# Patient Record
Sex: Male | Born: 1955 | Race: White | Hispanic: No | Marital: Single | Smoking: Former smoker
Health system: Southern US, Community
[De-identification: ages and names within clinical notes are randomized; demographics above are authoritative.]

## PROBLEM LIST (undated history)

## (undated) DIAGNOSIS — I10 Essential (primary) hypertension: Secondary | ICD-10-CM

## (undated) DIAGNOSIS — M1990 Unspecified osteoarthritis, unspecified site: Secondary | ICD-10-CM

## (undated) HISTORY — DX: Unspecified osteoarthritis, unspecified site: M19.90

## (undated) HISTORY — DX: Essential (primary) hypertension: I10

---

## 2012-12-14 DEATH — deceased

## 2018-04-10 ENCOUNTER — Encounter: Payer: Self-pay | Admitting: Cardiology

## 2018-04-10 ENCOUNTER — Ambulatory Visit (INDEPENDENT_AMBULATORY_CARE_PROVIDER_SITE_OTHER): Payer: PRIVATE HEALTH INSURANCE | Admitting: Cardiology

## 2018-04-10 ENCOUNTER — Ambulatory Visit (INDEPENDENT_AMBULATORY_CARE_PROVIDER_SITE_OTHER): Payer: PRIVATE HEALTH INSURANCE

## 2018-04-10 VITALS — BP 140/68 | HR 76 | Ht 69.0 in | Wt 201.0 lb

## 2018-04-10 DIAGNOSIS — E119 Type 2 diabetes mellitus without complications: Secondary | ICD-10-CM

## 2018-04-10 DIAGNOSIS — Z01810 Encounter for preprocedural cardiovascular examination: Secondary | ICD-10-CM

## 2018-04-10 DIAGNOSIS — R0789 Other chest pain: Secondary | ICD-10-CM | POA: Diagnosis not present

## 2018-04-10 DIAGNOSIS — I10 Essential (primary) hypertension: Secondary | ICD-10-CM

## 2018-04-10 DIAGNOSIS — G8929 Other chronic pain: Secondary | ICD-10-CM | POA: Insufficient documentation

## 2018-04-10 DIAGNOSIS — M1990 Unspecified osteoarthritis, unspecified site: Secondary | ICD-10-CM | POA: Insufficient documentation

## 2018-04-10 DIAGNOSIS — E782 Mixed hyperlipidemia: Secondary | ICD-10-CM

## 2018-04-10 DIAGNOSIS — M545 Low back pain, unspecified: Secondary | ICD-10-CM

## 2018-04-10 HISTORY — DX: Other chronic pain: G89.29

## 2018-04-10 HISTORY — DX: Essential (primary) hypertension: I10

## 2018-04-10 HISTORY — DX: Type 2 diabetes mellitus without complications: E11.9

## 2018-04-10 HISTORY — DX: Low back pain, unspecified: M54.50

## 2018-04-10 HISTORY — DX: Mixed hyperlipidemia: E78.2

## 2018-04-10 LAB — MYOCARDIAL PERFUSION IMAGING
CHL CUP NUCLEAR SSS: 20
CSEPEW: 9.7 METS
CSEPPHR: 137 {beats}/min
Exercise duration (min): 8 min
Exercise duration (sec): 57 s
LV dias vol: 115 mL (ref 62–150)
LV sys vol: 53 mL
MPHR: 157 {beats}/min
Percent HR: 87 %
Rest HR: 64 {beats}/min
SDS: 14
SRS: 7
TID: 1.11

## 2018-04-10 MED ORDER — TECHNETIUM TC 99M TETROFOSMIN IV KIT
9.2000 | PACK | Freq: Once | INTRAVENOUS | Status: AC | PRN
  Administered 2018-04-10: 9.2 via INTRAVENOUS

## 2018-04-10 MED ORDER — TECHNETIUM TC 99M TETROFOSMIN IV KIT
29.1000 | PACK | Freq: Once | INTRAVENOUS | Status: AC | PRN
  Administered 2018-04-10: 29.1 via INTRAVENOUS

## 2018-04-10 NOTE — Patient Instructions (Addendum)
Medication Instructions:  Your physician recommends that you continue on your current medications as directed. Please refer to the Current Medication list given to you today. If you need a refill on your cardiac medications before your next appointment, please call your pharmacy.   Lab work: Today  If you have labs (blood work) drawn today and your tests are completely normal, you will receive your results only by: Marland Kitchen MyChart Message (if you have MyChart) OR . A paper copy in the mail If you have any lab test that is abnormal or we need to change your treatment, we will call you to review the results.  Testing/Procedures: Your physician has requested that you have a lexiscan myoview. For further information please visit [email or web address] Please follow instruction sheet, as given.   Follow-Up: At [facility], you and your health needs are our priority.  As part of our continuing mission to provide you with exceptional heart care, we have created designated Provider Care Teams.  These Care Teams include your primary Cardiologist (physician) and Advanced Practice Providers (APPs -  Physician Assistants and Nurse Practitioners) who all work together to provide you with the care you need, when you need it. You will need a follow up appointment in 6 months.  Please call our office 2 months in advance to schedule this appointment.  You may see Dr. Tomie China or another member of our [facility] Provider Team in [place]: Gypsy Balsam, MD . Norman Herrlich, MD      [facility] CARDIOVASCULAR DIVISION [employer] AT [facility] [address] [place] [address] Dept: [phone] Loc: [phone]  Eddie Nelson  04/10/2018  You are scheduled for a Cardiac Catheterization on Thursday, February 27 with Dr. Nicki Guadalajara.  1. Please arrive at the [facility] (Main Entrance A) at [facility]: [address] [place], [place] [address] at 10:00 AM  (This time is two hours before your procedure to ensure your preparation). Free valet parking service is available.   Special note: Every effort is made to have your procedure done on time. Please understand that emergencies sometimes delay scheduled procedures.  2. Diet: Do not eat solid foods after midnight.  The patient may have clear liquids until 5am upon the day of the procedure.  3. Labs: You will need to have blood drawn today. You do not need to be fasting.  4. Medication instructions in preparation for your procedure:   Contrast Allergy: No    Current Outpatient Medications (Cardiovascular):  .  lisinopril (PRINIVIL,ZESTRIL) 10 MG tablet, Take 1 tablet by mouth daily.   Current Outpatient Medications (Analgesics):  .  HYDROcodone-acetaminophen (NORCO/VICODIN) 5-325 MG tablet, Take 1 tablet by mouth as needed. .  traMADol (ULTRAM) 50 MG tablet, Take 1 tablet by mouth every 4 (four) hours.   Current Outpatient Medications (Other):  .  cyclobenzaprine (FLEXERIL) 10 MG tablet, Take 1 tablet by mouth as needed. *For reference purposes while preparing patient instructions.   Delete this med list prior to printing instructions for patient.*   On the morning of your procedure, take your Aspirin and any morning medicines NOT listed above.  You may use sips of water.  5. Plan for one night stay--bring personal belongings. 6. Bring a current list of your medications and current insurance cards. 7. You MUST have a responsible person to drive you home. 8. Someone MUST be with you the first 24 hours after you arrive home or your discharge will be delayed. 9.  Please wear clothes that are easy to get on and off and wear slip-on shoes.  Thank you for allowing [place] to care for you!   -- [place] [facility]

## 2018-04-10 NOTE — Addendum Note (Signed)
Addended by: Belva Crome R on: 04/10/2018 11:49 AM   Modules accepted: Orders, SmartSet

## 2018-04-10 NOTE — Progress Notes (Addendum)
Cardiology Office Note:    Date:  04/10/2018   ID:  Eddie Nelson, DOB March 23, 1955, MRN [id number]  PCP:  Simone Curia, MD  Cardiologist:  Garwin Brothers, MD   Referring MD: Simone Curia, MD    ASSESSMENT:    1. Pre-operative cardiovascular examination   2. Essential hypertension   3. Diet-controlled diabetes mellitus (HCC)   4. Mixed dyslipidemia   5. Chest discomfort    PLAN:    In order of problems listed above:  1. Primary prevention stressed with the patient.  Importance of compliance with diet and medication stressed and he vocalized understanding.  His blood pressure is stable.  Diet was discussed with dyslipidemia and diabetes mellitus.  He is fasting and will have all blood work done today. 2. He will undergo Lexiscan sestamibi testing for preoperative purposes.  If this is negative then he is not at high risk for coronary events during the aforementioned surgery.  Meticulous hemodynamic monitoring will further reduce the risk of coronary events. Patient will be seen in follow-up appointment in 6 months or earlier if the patient has any concerns  Addendum at 11:47 AM  Fortunately we were able to get this patient in for an exercise stress Cardiolite.  Fair exercise capacity his blood pressure response was hypertensive systolic function was preserved but significant area of anteroseptum was found to be in jeopardy for this reason the following recommendations were made to the patient. I discussed coronary angiography and left heart catheterization with the patient at extensive length. Procedure, benefits and potential risks were explained. Patient had multiple questions which were answered to the patient's satisfaction. Patient agreed and consented for the procedure. Further recommendations will be made based on the findings of the coronary angiography. In the interim. The patient has any significant symptoms he knows to go to the nearest emergency room. Signed Belva Crome  MD    Medication Adjustments/Labs and Tests Ordered: Current medicines are reviewed at length with the patient today.  Concerns regarding medicines are outlined above.  No orders of the defined types were placed in this encounter.  No orders of the defined types were placed in this encounter.    History of Present Illness:    Eddie Nelson is a 63 y.o. male who is being seen today for the evaluation of preop cardiovascular assessment at the request of Simone Curia, MD.  Patient is a pleasant 63 year old male.  He has past medical history of essential hypertension and dyslipidemia.  He mentions to me that he has diet-controlled diabetes mellitus.  He says he is having severe back pain and is contemplating surgery his primary care physician noticed that he has an abnormal EKG.  He sent him for evaluation.  Patient gives history of chest discomfort at times not related to exertion.  No orthopnea or PND.  For obvious reasons he leads a sedentary lifestyle.  At the time of my evaluation, the patient is alert awake oriented and in no distress.  Past Medical History:  Diagnosis Date  . Hypertension   . Osteoarthritis     History reviewed. No pertinent surgical history.  Current Medications: Current Meds  Medication Sig  . cyclobenzaprine (FLEXERIL) 10 MG tablet Take 1 tablet by mouth as needed.  Marland Kitchen HYDROcodone-acetaminophen (NORCO/VICODIN) 5-325 MG tablet Take 1 tablet by mouth as needed.  Marland Kitchen lisinopril (PRINIVIL,ZESTRIL) 10 MG tablet Take 1 tablet by mouth daily.  . traMADol (ULTRAM) 50 MG tablet Take 1 tablet by mouth every 4 (four) hours.  Allergies:   Patient has no known allergies.   Social History   Socioeconomic History  . Marital status: Unknown    Spouse name: Not on file  . Number of children: Not on file  . Years of education: Not on file  . Highest education level: Not on file  Occupational History  . Not on file  Social Needs  . Financial resource strain: Not on  file  . Food insecurity:    Worry: Not on file    Inability: Not on file  . Transportation needs:    Medical: Not on file    Non-medical: Not on file  Tobacco Use  . Smoking status: Former Games developer  . Smokeless tobacco: Never Used  Substance and Sexual Activity  . Alcohol use: Not on file  . Drug use: Not on file  . Sexual activity: Not on file  Lifestyle  . Physical activity:    Days per week: Not on file    Minutes per session: Not on file  . Stress: Not on file  Relationships  . Social connections:    Talks on phone: Not on file    Gets together: Not on file    Attends religious service: Not on file    Active member of club or organization: Not on file    Attends meetings of clubs or organizations: Not on file    Relationship status: Not on file  Other Topics Concern  . Not on file  Social History Narrative  . Not on file     Family History: The patient's family history includes Heart disease in his father; Hodgkin's lymphoma in his sister; Hyperlipidemia in his brother.  ROS:   Please see the history of present illness.    All other systems reviewed and are negative.  EKGs/Labs/Other Studies Reviewed:    The following studies were reviewed today: EKG reveals sinus rhythm and inferior wall myocardial infarction of undetermined age.   Recent Labs: No results found for requested labs within last 8760 hours.  Recent Lipid Panel No results found for: CHOL, TRIG, HDL, CHOLHDL, VLDL, LDLCALC, LDLDIRECT  Physical Exam:    VS:  BP 140/68 (BP Location: Right Arm, Patient Position: Sitting, Cuff Size: Normal)   Pulse 76   Ht 5\' 9"  (1.753 m)   Wt 201 lb (91.2 kg)   SpO2 97%   BMI 29.68 kg/m     Wt Readings from Last 3 Encounters:  04/10/18 201 lb (91.2 kg)     GEN: Patient is in no acute distress HEENT: Normal NECK: No JVD; No carotid bruits LYMPHATICS: No lymphadenopathy CARDIAC: S1 S2 regular, 2/6 systolic murmur at the apex. RESPIRATORY:  Clear to  auscultation without rales, wheezing or rhonchi  ABDOMEN: Soft, non-tender, non-distended MUSCULOSKELETAL:  No edema; No deformity  SKIN: Warm and dry NEUROLOGIC:  Alert and oriented x 3 PSYCHIATRIC:  Normal affect    Signed, Garwin Brothers, MD  04/10/2018 8:54 AM    [facility]

## 2018-04-10 NOTE — Addendum Note (Signed)
Addended by: Rodney Langton on: 04/10/2018 09:25 AM   Modules accepted: Orders

## 2018-04-11 ENCOUNTER — Other Ambulatory Visit: Payer: Self-pay | Admitting: *Deleted

## 2018-04-11 ENCOUNTER — Other Ambulatory Visit: Payer: Self-pay

## 2018-04-11 ENCOUNTER — Encounter (HOSPITAL_COMMUNITY)
Admission: AD | Disposition: A | Discharge status: Home Health | Payer: Self-pay | Source: Home / Self Care | Attending: Cardiothoracic Surgery

## 2018-04-11 ENCOUNTER — Encounter (HOSPITAL_COMMUNITY): Payer: Self-pay | Admitting: *Deleted

## 2018-04-11 ENCOUNTER — Inpatient Hospital Stay (HOSPITAL_COMMUNITY)
Admission: AD | Admit: 2018-04-11 | Discharge: 2018-04-20 | DRG: 234 | Disposition: A | Discharge status: Home Health | Payer: PRIVATE HEALTH INSURANCE | Attending: Cardiothoracic Surgery | Admitting: Cardiothoracic Surgery

## 2018-04-11 DIAGNOSIS — Z79899 Other long term (current) drug therapy: Secondary | ICD-10-CM

## 2018-04-11 DIAGNOSIS — D62 Acute posthemorrhagic anemia: Secondary | ICD-10-CM | POA: Diagnosis not present

## 2018-04-11 DIAGNOSIS — E785 Hyperlipidemia, unspecified: Secondary | ICD-10-CM | POA: Diagnosis present

## 2018-04-11 DIAGNOSIS — G8929 Other chronic pain: Secondary | ICD-10-CM | POA: Diagnosis present

## 2018-04-11 DIAGNOSIS — Z87891 Personal history of nicotine dependence: Secondary | ICD-10-CM | POA: Diagnosis not present

## 2018-04-11 DIAGNOSIS — I2582 Chronic total occlusion of coronary artery: Secondary | ICD-10-CM | POA: Diagnosis present

## 2018-04-11 DIAGNOSIS — Z951 Presence of aortocoronary bypass graft: Secondary | ICD-10-CM

## 2018-04-11 DIAGNOSIS — M1990 Unspecified osteoarthritis, unspecified site: Secondary | ICD-10-CM | POA: Diagnosis present

## 2018-04-11 DIAGNOSIS — R0689 Other abnormalities of breathing: Secondary | ICD-10-CM | POA: Diagnosis not present

## 2018-04-11 DIAGNOSIS — M545 Low back pain: Secondary | ICD-10-CM | POA: Diagnosis present

## 2018-04-11 DIAGNOSIS — R9439 Abnormal result of other cardiovascular function study: Secondary | ICD-10-CM

## 2018-04-11 DIAGNOSIS — D696 Thrombocytopenia, unspecified: Secondary | ICD-10-CM | POA: Diagnosis not present

## 2018-04-11 DIAGNOSIS — R0789 Other chest pain: Secondary | ICD-10-CM

## 2018-04-11 DIAGNOSIS — E8779 Other fluid overload: Secondary | ICD-10-CM | POA: Diagnosis not present

## 2018-04-11 DIAGNOSIS — Z8249 Family history of ischemic heart disease and other diseases of the circulatory system: Secondary | ICD-10-CM | POA: Diagnosis not present

## 2018-04-11 DIAGNOSIS — I2510 Atherosclerotic heart disease of native coronary artery without angina pectoris: Secondary | ICD-10-CM

## 2018-04-11 DIAGNOSIS — E119 Type 2 diabetes mellitus without complications: Secondary | ICD-10-CM | POA: Diagnosis present

## 2018-04-11 DIAGNOSIS — Z01810 Encounter for preprocedural cardiovascular examination: Secondary | ICD-10-CM

## 2018-04-11 DIAGNOSIS — I10 Essential (primary) hypertension: Secondary | ICD-10-CM | POA: Diagnosis present

## 2018-04-11 DIAGNOSIS — I25110 Atherosclerotic heart disease of native coronary artery with unstable angina pectoris: Secondary | ICD-10-CM | POA: Diagnosis not present

## 2018-04-11 DIAGNOSIS — E782 Mixed hyperlipidemia: Secondary | ICD-10-CM

## 2018-04-11 DIAGNOSIS — I25118 Atherosclerotic heart disease of native coronary artery with other forms of angina pectoris: Secondary | ICD-10-CM | POA: Diagnosis not present

## 2018-04-11 DIAGNOSIS — R9431 Abnormal electrocardiogram [ECG] [EKG]: Secondary | ICD-10-CM | POA: Diagnosis present

## 2018-04-11 HISTORY — DX: Atherosclerotic heart disease of native coronary artery without angina pectoris: I25.10

## 2018-04-11 HISTORY — PX: LEFT HEART CATH AND CORONARY ANGIOGRAPHY: CATH118249

## 2018-04-11 LAB — CBC
HCT: 41.3 % (ref 39.0–52.0)
HEMATOCRIT: 44.3 % (ref 37.5–51.0)
Hemoglobin: 15.2 g/dL (ref 13.0–17.7)
Hemoglobin: 14 g/dL (ref 13.0–17.0)
MCH: 31.3 pg (ref 26.6–33.0)
MCH: 31 pg (ref 26.0–34.0)
MCHC: 34.3 g/dL (ref 31.5–35.7)
MCHC: 33.9 g/dL (ref 30.0–36.0)
MCV: 91 fL (ref 79–97)
MCV: 91.4 fL (ref 80.0–100.0)
Platelets: 199 10*3/uL (ref 150–450)
Platelets: 165 10*3/uL (ref 150–400)
RBC: 4.85 x10E6/uL (ref 4.14–5.80)
RBC: 4.52 MIL/uL (ref 4.22–5.81)
RDW: 13 % (ref 11.6–15.4)
RDW: 12.3 % (ref 11.5–15.5)
WBC: 7.6 10*3/uL (ref 3.4–10.8)
WBC: 7.9 10*3/uL (ref 4.0–10.5)
nRBC: 0 % (ref 0.0–0.2)

## 2018-04-11 LAB — BASIC METABOLIC PANEL
BUN/Creatinine Ratio: 27 — ABNORMAL HIGH (ref 10–24)
BUN: 22 mg/dL (ref 8–27)
CO2: 22 mmol/L (ref 20–29)
Calcium: 9.6 mg/dL (ref 8.6–10.2)
Chloride: 101 mmol/L (ref 96–106)
Creatinine, Ser: 0.81 mg/dL (ref 0.76–1.27)
GFR calc Af Amer: 109 mL/min/{1.73_m2} (ref >59)
GFR calc non Af Amer: 95 mL/min/{1.73_m2} (ref >59)
Glucose: 97 mg/dL (ref 65–99)
Potassium: 4.4 mmol/L (ref 3.5–5.2)
Sodium: 139 mmol/L (ref 134–144)

## 2018-04-11 LAB — URINALYSIS, ROUTINE W REFLEX MICROSCOPIC
Bilirubin Urine: NEGATIVE
Glucose, UA: NEGATIVE mg/dL
Hgb urine dipstick: NEGATIVE
Ketones, ur: NEGATIVE mg/dL
Leukocytes,Ua: NEGATIVE
Nitrite: NEGATIVE
Protein, ur: NEGATIVE mg/dL
Specific Gravity, Urine: >1.046 — ABNORMAL HIGH (ref 1.005–1.030)
pH: 6 (ref 5.0–8.0)

## 2018-04-11 LAB — SURGICAL PCR SCREEN
MRSA, PCR: NEGATIVE
Staphylococcus aureus: NEGATIVE

## 2018-04-11 LAB — LIPID PANEL
Chol/HDL Ratio: 6.3 ratio — ABNORMAL HIGH (ref 0.0–5.0)
Cholesterol, Total: 301 mg/dL — ABNORMAL HIGH (ref 100–199)
HDL: 48 mg/dL (ref >39)
LDL Calculated: 230 mg/dL — ABNORMAL HIGH (ref 0–99)
Triglycerides: 114 mg/dL (ref 0–149)
VLDL Cholesterol Cal: 23 mg/dL (ref 5–40)

## 2018-04-11 LAB — TSH: TSH: 4.25 u[IU]/mL (ref 0.450–4.500)

## 2018-04-11 LAB — CREATININE, SERUM
Creatinine, Ser: 0.73 mg/dL (ref 0.61–1.24)
GFR calc Af Amer: >60 mL/min (ref >60)
GFR calc non Af Amer: >60 mL/min (ref >60)

## 2018-04-11 LAB — HEPATIC FUNCTION PANEL
ALT: 27 IU/L (ref 0–44)
AST: 15 IU/L (ref 0–40)
Albumin: 4.7 g/dL (ref 3.8–4.8)
Alkaline Phosphatase: 35 IU/L — ABNORMAL LOW (ref 39–117)
BILIRUBIN TOTAL: 0.4 mg/dL (ref 0.0–1.2)
Bilirubin, Direct: 0.12 mg/dL (ref 0.00–0.40)
Total Protein: 7.1 g/dL (ref 6.0–8.5)

## 2018-04-11 SURGERY — LEFT HEART CATH AND CORONARY ANGIOGRAPHY
Anesthesia: LOCAL

## 2018-04-11 MED ORDER — SODIUM CHLORIDE 0.9 % WEIGHT BASED INFUSION
1.0000 mL/kg/h | INTRAVENOUS | Status: DC
  Administered 2018-04-11: 1 mL/kg/h via INTRAVENOUS

## 2018-04-11 MED ORDER — SODIUM CHLORIDE 0.9 % IV SOLN: 250.0000 mL | INTRAVENOUS | Status: DC | PRN

## 2018-04-11 MED ORDER — SODIUM CHLORIDE 0.9 % IV SOLN
INTRAVENOUS | Status: DC
  Administered 2018-04-11 – 2018-04-12 (×2): via INTRAVENOUS

## 2018-04-11 MED ORDER — HYDROCODONE-ACETAMINOPHEN 5-325 MG PO TABS
1.0000 | ORAL_TABLET | Freq: Four times a day (QID) | ORAL | Status: DC | PRN
  Administered 2018-04-11 – 2018-04-15 (×8): 1 via ORAL
  Filled 2018-04-11 (×8): qty 1

## 2018-04-11 MED ORDER — IOHEXOL 350 MG/ML SOLN
INTRAVENOUS | Status: DC | PRN
  Administered 2018-04-11: 125 mL via INTRAVENOUS

## 2018-04-11 MED ORDER — ASPIRIN 81 MG PO CHEW
81.0000 mg | CHEWABLE_TABLET | Freq: Every day | ORAL | Status: DC
  Administered 2018-04-12 – 2018-04-14 (×3): 81 mg via ORAL
  Filled 2018-04-11 (×4): qty 1

## 2018-04-11 MED ORDER — CYCLOBENZAPRINE HCL 10 MG PO TABS
10.0000 mg | ORAL_TABLET | Freq: Three times a day (TID) | ORAL | Status: DC | PRN
  Administered 2018-04-11 – 2018-04-14 (×3): 10 mg via ORAL
  Filled 2018-04-11 (×3): qty 1

## 2018-04-11 MED ORDER — HEPARIN (PORCINE) IN NACL 1000-0.9 UT/500ML-% IV SOLN
INTRAVENOUS | Status: DC | PRN
  Administered 2018-04-11 (×3): 500 mL

## 2018-04-11 MED ORDER — HYDROCODONE-ACETAMINOPHEN 5-325 MG PO TABS
1.0000 | ORAL_TABLET | Freq: Once | ORAL | Status: AC
  Administered 2018-04-11: 1 via ORAL
  Filled 2018-04-11: qty 1

## 2018-04-11 MED ORDER — ACETAMINOPHEN 325 MG PO TABS
650.0000 mg | ORAL_TABLET | ORAL | Status: DC | PRN
  Administered 2018-04-11: 650 mg via ORAL
  Filled 2018-04-11: qty 2

## 2018-04-11 MED ORDER — SODIUM CHLORIDE 0.9% FLUSH: 3.0000 mL | INTRAVENOUS | Status: DC | PRN

## 2018-04-11 MED ORDER — HEPARIN SODIUM (PORCINE) 5000 UNIT/ML IJ SOLN
5000.0000 [IU] | Freq: Three times a day (TID) | INTRAMUSCULAR | Status: DC
  Administered 2018-04-11 – 2018-04-15 (×11): 5000 [IU] via SUBCUTANEOUS
  Filled 2018-04-11 (×11): qty 1

## 2018-04-11 MED ORDER — FENTANYL CITRATE (PF) 100 MCG/2ML IJ SOLN
INTRAMUSCULAR | Status: DC | PRN
  Administered 2018-04-11: 50 ug via INTRAVENOUS

## 2018-04-11 MED ORDER — ONDANSETRON HCL 4 MG/2ML IJ SOLN: 4.0000 mg | Freq: Four times a day (QID) | INTRAMUSCULAR | Status: DC | PRN

## 2018-04-11 MED ORDER — HEPARIN (PORCINE) IN NACL 1000-0.9 UT/500ML-% IV SOLN
INTRAVENOUS | Status: AC
  Filled 2018-04-11: qty 500

## 2018-04-11 MED ORDER — SODIUM CHLORIDE 0.9% FLUSH: 3.0000 mL | Freq: Two times a day (BID) | INTRAVENOUS | Status: DC

## 2018-04-11 MED ORDER — HEPARIN (PORCINE) IN NACL 1000-0.9 UT/500ML-% IV SOLN
INTRAVENOUS | Status: AC
  Filled 2018-04-11: qty 1000

## 2018-04-11 MED ORDER — HEPARIN SODIUM (PORCINE) 1000 UNIT/ML IJ SOLN
INTRAMUSCULAR | Status: AC
  Filled 2018-04-11: qty 1

## 2018-04-11 MED ORDER — LIDOCAINE HCL (PF) 1 % IJ SOLN
INTRAMUSCULAR | Status: DC | PRN
  Administered 2018-04-11: 3 mL

## 2018-04-11 MED ORDER — VERAPAMIL HCL 2.5 MG/ML IV SOLN
INTRAVENOUS | Status: DC | PRN
  Administered 2018-04-11: 10 mL via INTRA_ARTERIAL

## 2018-04-11 MED ORDER — DIAZEPAM 5 MG PO TABS
5.0000 mg | ORAL_TABLET | ORAL | Status: DC | PRN
  Administered 2018-04-11 – 2018-04-14 (×3): 5 mg via ORAL
  Filled 2018-04-11 (×3): qty 1

## 2018-04-11 MED ORDER — LIDOCAINE HCL (PF) 1 % IJ SOLN
INTRAMUSCULAR | Status: AC
  Filled 2018-04-11: qty 30

## 2018-04-11 MED ORDER — ATORVASTATIN CALCIUM 80 MG PO TABS
80.0000 mg | ORAL_TABLET | Freq: Every day | ORAL | Status: DC
  Administered 2018-04-11 – 2018-04-14 (×4): 80 mg via ORAL
  Filled 2018-04-11 (×5): qty 1

## 2018-04-11 MED ORDER — ASPIRIN 81 MG PO CHEW: 81.0000 mg | CHEWABLE_TABLET | ORAL | Status: DC

## 2018-04-11 MED ORDER — MIDAZOLAM HCL 2 MG/2ML IJ SOLN
INTRAMUSCULAR | Status: DC | PRN
  Administered 2018-04-11: 2 mg via INTRAVENOUS

## 2018-04-11 MED ORDER — SODIUM CHLORIDE 0.9 % WEIGHT BASED INFUSION
3.0000 mL/kg/h | INTRAVENOUS | Status: DC
  Administered 2018-04-11: 3 mL/kg/h via INTRAVENOUS

## 2018-04-11 MED ORDER — MIDAZOLAM HCL 2 MG/2ML IJ SOLN
INTRAMUSCULAR | Status: AC
  Filled 2018-04-11: qty 2

## 2018-04-11 MED ORDER — METOPROLOL TARTRATE 12.5 MG HALF TABLET
12.5000 mg | ORAL_TABLET | Freq: Two times a day (BID) | ORAL | Status: DC
  Administered 2018-04-11 – 2018-04-14 (×8): 12.5 mg via ORAL
  Filled 2018-04-11 (×9): qty 1

## 2018-04-11 MED ORDER — HEPARIN SODIUM (PORCINE) 1000 UNIT/ML IJ SOLN
INTRAMUSCULAR | Status: DC | PRN
  Administered 2018-04-11: 4500 [IU] via INTRAVENOUS

## 2018-04-11 MED ORDER — AMLODIPINE BESYLATE 5 MG PO TABS
5.0000 mg | ORAL_TABLET | Freq: Every day | ORAL | Status: DC
  Administered 2018-04-11 – 2018-04-14 (×4): 5 mg via ORAL
  Filled 2018-04-11: qty 2
  Filled 2018-04-11 (×2): qty 1
  Filled 2018-04-11: qty 2

## 2018-04-11 MED ORDER — SODIUM CHLORIDE 0.9% FLUSH
3.0000 mL | Freq: Two times a day (BID) | INTRAVENOUS | Status: DC
  Administered 2018-04-12 – 2018-04-14 (×5): 3 mL via INTRAVENOUS

## 2018-04-11 MED ORDER — FENTANYL CITRATE (PF) 100 MCG/2ML IJ SOLN
INTRAMUSCULAR | Status: AC
  Filled 2018-04-11: qty 2

## 2018-04-11 MED ORDER — VERAPAMIL HCL 2.5 MG/ML IV SOLN
INTRAVENOUS | Status: AC
  Filled 2018-04-11: qty 2

## 2018-04-11 SURGICAL SUPPLY — 12 items
CATH INFINITI 5 FR JL3.5 (CATHETERS) ×2 IMPLANT
CATH INFINITI 5FR ANG PIGTAIL (CATHETERS) ×2 IMPLANT
CATH OPTITORQUE TIG 4.0 5F (CATHETERS) ×2 IMPLANT
DEVICE RAD COMP TR BAND LRG (VASCULAR PRODUCTS) ×2 IMPLANT
GLIDESHEATH SLEND SS 6F .021 (SHEATH) ×2 IMPLANT
GUIDEWIRE INQWIRE 1.5J.035X260 (WIRE) ×1 IMPLANT
INQWIRE 1.5J .035X260CM (WIRE) ×2
KIT HEART LEFT (KITS) ×2 IMPLANT
PACK CARDIAC CATHETERIZATION (CUSTOM PROCEDURE TRAY) ×2 IMPLANT
SYR MEDRAD MARK 7 150ML (SYRINGE) ×2 IMPLANT
TRANSDUCER W/STOPCOCK (MISCELLANEOUS) ×2 IMPLANT
TUBING CIL FLEX 10 FLL-RA (TUBING) ×2 IMPLANT

## 2018-04-11 NOTE — H&P (Addendum)
[address]       [place] [address]             [phone]        [facility] Medical Record #[id number] Date of Birth: 1955-11-09  Referring: No ref. provider found Primary Care: Simone Curia, MD Primary Cardiologist:No primary care provider on file.  Chief Complaint:   No chief complaint on file.   History of Present Illness:      Mr. Eddie Nelson is a 63 year old male patient with a past medical history significant for essential hypertension, osteoarthritis, diabetes mellitus type 2, and hyperlipidemia who was evaluated by his primary care provider for clearance for back surgery and during his visit had an abnormal EKG.  He was sent to Dr. Kem Parkinson office for further evaluation.  The patient underwent an exercise stress Cardiolite test.  This showed that he had a fair exercise capacity but he did have a hypertensive response and significant area of anterior septum was found to be in jeopardy.  Dr. Tomie China recommended coronary angiography and left heart catheterization. He underwent cardiac catheterization this afternoon which showed 80% stenosis of the proximal RCA, 95% stenosis of the mid RCA, 99% stenosis of the PDA, 50% stenosis of the proximal circumflex, 90% stenosis of the ostial first margin, and 95% stenosis of the proximal to mid LAD.  He had a estimated left ejection fraction of 50 to 55%.  There is no recent echocardiogram. The patient did not have any symptoms such as shortness of breath or chest pain. This could be due to the collateral blood flow found on cardiac cath. He is not having any chest pain now. We are consulted for possible revascularization.   Current Activity/ Functional Status: Patient was independent with mobility/ambulation, transfers, ADL's, IADL's.   Zubrod Score: At the time of surgery this patient's most appropriate activity status/level should be described as: []     0    Normal activity, no symptoms [x]      1    Restricted in physical strenuous activity but ambulatory, able to do out light work []     2    Ambulatory and capable of self care, unable to do work activities, up and about                 more than 50%  Of the time                            []     3    Only limited self care, in bed greater than 50% of waking hours []     4    Completely disabled, no self care, confined to bed or chair []     5    Moribund  Past Medical History:  Diagnosis Date  . Hypertension   . Osteoarthritis     History reviewed. No pertinent surgical history.  Social History   Tobacco Use  Smoking Status Former Smoker  Smokeless Tobacco Never Used    Social History   Substance and Sexual Activity  Alcohol Use Not on file     No Known Allergies  Current Facility-Administered Medications  Medication Dose Route Frequency Provider Last Rate Last Dose  . 0.9 %  sodium chloride infusion  250 mL Intravenous PRN Revankar, Aundra Dubin, MD      . 0.9% sodium chloride infusion  1 mL/kg/hr Intravenous Continuous Revankar, Aundra Dubin, MD  91.2 mL/hr at 04/11/18 1138 1 mL/kg/hr at 04/11/18 1138  . sodium chloride flush (NS) 0.9 % injection 3 mL  3 mL Intravenous Q12H Revankar, Rajan R, MD      . sodium chloride flush (NS) 0.9 % injection 3 mL  3 mL Intravenous PRN Revankar, Aundra Dubin, MD        Medications Prior to Admission  Medication Sig Dispense Refill Last Dose  . cyclobenzaprine (FLEXERIL) 10 MG tablet Take 1 tablet by mouth 3 (three) times daily as needed for muscle spasms.    Past Week at Unknown time  . HYDROcodone-acetaminophen (NORCO/VICODIN) 5-325 MG tablet Take 1 tablet by mouth every 6 (six) hours as needed (pain).    04/10/2018 at 2200  . lisinopril (PRINIVIL,ZESTRIL) 10 MG tablet Take 1 tablet by mouth daily.   04/10/2018 at 0800  . traMADol (ULTRAM) 50 MG tablet Take 50 mg by mouth every 6 (six) hours as needed for moderate pain.    Past Week at Unknown time    Family History  Problem Relation Age of  Onset  . Heart disease Father   . Hodgkin's lymphoma Sister   . Hyperlipidemia Brother      Review of Systems:   Review of Systems  Constitutional: Negative for chills, fever, malaise/fatigue and weight loss.  Respiratory: Negative for cough, sputum production and shortness of breath.   Cardiovascular: Negative for chest pain and leg swelling.  Gastrointestinal: Negative for abdominal pain, diarrhea, heartburn, nausea and vomiting.  Musculoskeletal: Positive for back pain.   Pertinent items are noted in HPI.    Physical Exam: BP (!) 172/93   Pulse 70   Temp (!) 97.5 F (36.4 C) (Oral)   Resp 18   Ht  (1.753 m)   Wt 90.7 kg   SpO2 98%   BMI 29.53 kg/m    General appearance: alert, cooperative and no distress Resp: clear to auscultation bilaterally Cardio: regular rate and rhythm, S1, S2 normal, no murmur, click, rub or gallop GI: soft, non-tender; bowel sounds normal; no masses,  no organomegaly Extremities: extremities normal, atraumatic, no cyanosis or edema. Legs appear to have adequate conduit.   Neurologic: Grossly normal  Diagnostic Studies & Laboratory data:   Prox RCA lesion is 80% stenosed.  Mid RCA lesion is 95% stenosed.  Ost RPDA to RPDA lesion is 99% stenosed.  Prox Cx lesion is 50% stenosed.  Ost 1st Mrg lesion is 90% stenosed.  Prox LAD to Mid LAD lesion is 95% stenosed.  The left ventricular ejection fraction is 50-55% by visual estimate.   Severe multivessel coronary obstructive disease with subtotal stenosis of the mid LAD between the second and third diagonal vessels; 50 and 95% stenosis of the proximal circumflex extending into a large OM1 branch which supplies the apex; and large dominant RCA with 80% proximal stenosis, 95% mid stenosis, patent and filling of the distal RCA with possible subtotal occlusion of the proximal PDA with both right to PDA and LAD to PDA retrograde collaterals.  Normal LV function with an EF of 50%.  There  is focal mild mid inferior hypocontractility and a suggestion of possible mild focal anterolateral hypocontractility.  LVEDP is 17 mm.  RECOMMENDATION: Surgical consultation will be obtained for CABG revascularization surgery.  Patient has severe multivessel CAD with excellent targets for bypass surgery.  We will keep him overnight so that this process can be initiated and will initiate anti-ischemic medical therapy.  The patient will initially be treated with aspirin.  High potency statin therapy with target LDL less than 70.      Recent Radiology Findings:   No results found.   I have independently reviewed the above radiologic studies and discussed with the patient   Recent Lab Findings: Lab Results  Component Value Date   WBC 7.6 04/10/2018   HGB 15.2 04/10/2018   HCT 44.3 04/10/2018   PLT 199 04/10/2018   GLUCOSE 97 04/10/2018   CHOL 301 (H) 04/10/2018   TRIG 114 04/10/2018   HDL 48 04/10/2018   LDLCALC 230 (H) 04/10/2018   ALT 27 04/10/2018   AST 15 04/10/2018   NA 139 04/10/2018   K 4.4 04/10/2018   CL 101 04/10/2018   CREATININE 0.81 04/10/2018   BUN 22 04/10/2018   CO2 22 04/10/2018   TSH 4.250 04/10/2018      Assessment / Plan:      1.  Multivessel coronary artery disease-catheterization report listed above.  It does not appear that he received any antiplatelet medication.  Continue metoprolol 12.5 mg twice daily, and amlodipine 5 mg daily for blood pressure and heart rate control.  Echocardiogram ordered as well as bilateral Doppler ultrasound. 2. Hyperlipidemia-not on statin therapy 3. Hypertension-holding lisinopril, on Norvasc 4. Diabetes Mellitus-not on medication at this time. Will order a Hemoglobin A1C for further evaluation 5. Osteoarthritis-holding any NSAID medication. Asking about timing of surgery for his back surgery. 6. Remote history of smoking-quit back in early 2000s.   Plan: Explained coronary artery bypass grafting in detail with the  patient, brother, and mother at the bedside.  All questions were answered to their satisfaction.  Plan is for coronary artery bypass grafting with Dr. Donata Clay on Monday, March 2nd.  The patient agrees and plans to proceed.   I  spent 40 minutes counseling the patient face to face.   Jari Favre, PA-C 04/11/2018 2:56 PM   Patient examined, images of coronary angiogram personally reviewed and discussed with patient. Patient had stress Cardiolite scan for cardiac clearance prior to back surgery.  The study was positive for ischemia.  Cardiac catheterization yesterday by Dr. Tresa Endo demonstrates severe three-vessel coronary disease with preserved LV systolic function.  The LAD circumflex and RCA all have greater than 90-95% stenoses.  Patient has positive family history of CAD-his father had CABG almost 20 years ago.  Preoperative studies have been reviewed.  Echocardiogram still pending.  I agree with the patient's cardiologist recommendation for CABG.  Surgical be scheduled for Monday, March 2 a.m.  I have discussed the details of surgery with the patient including expected benefits and potential risks.  He agrees to remain in the hospital for close observation and probable heparin anticoagulation until surgery.

## 2018-04-11 NOTE — Progress Notes (Signed)
TCTS consulted for CABG evaluation. °

## 2018-04-12 ENCOUNTER — Inpatient Hospital Stay (HOSPITAL_COMMUNITY): Payer: PRIVATE HEALTH INSURANCE

## 2018-04-12 ENCOUNTER — Encounter (HOSPITAL_COMMUNITY): Payer: Self-pay | Admitting: Cardiovascular Disease

## 2018-04-12 DIAGNOSIS — R9439 Abnormal result of other cardiovascular function study: Secondary | ICD-10-CM

## 2018-04-12 DIAGNOSIS — I2510 Atherosclerotic heart disease of native coronary artery without angina pectoris: Secondary | ICD-10-CM

## 2018-04-12 DIAGNOSIS — E782 Mixed hyperlipidemia: Secondary | ICD-10-CM

## 2018-04-12 DIAGNOSIS — I25118 Atherosclerotic heart disease of native coronary artery with other forms of angina pectoris: Secondary | ICD-10-CM

## 2018-04-12 DIAGNOSIS — Z01810 Encounter for preprocedural cardiovascular examination: Secondary | ICD-10-CM

## 2018-04-12 DIAGNOSIS — E119 Type 2 diabetes mellitus without complications: Secondary | ICD-10-CM

## 2018-04-12 LAB — PULMONARY FUNCTION TEST
DL/VA % pred: 100 %
DL/VA: 4.23 ml/min/mmHg/L
DLCO cor % pred: 95 %
DLCO cor: 25.03 ml/min/mmHg
DLCO unc % pred: 93 %
DLCO unc: 24.67 ml/min/mmHg
FEF 25-75 Post: 4.26 L/sec
FEF 25-75 Pre: 3.77 L/sec
FEF2575-%Change-Post: 13 %
FEF2575-%Pred-Post: 156 %
FEF2575-%Pred-Pre: 137 %
FEV1-%Change-Post: 4 %
FEV1-%Pred-Post: 107 %
FEV1-%Pred-Pre: 102 %
FEV1-Post: 3.63 L
FEV1-Pre: 3.47 L
FEV1FVC-%Change-Post: 3 %
FEV1FVC-%Pred-Pre: 110 %
FEV6-%Change-Post: 1 %
FEV6-%Pred-Post: 99 %
FEV6-%Pred-Pre: 97 %
FEV6-Post: 4.25 L
FEV6-Pre: 4.17 L
FEV6FVC-%Change-Post: 0 %
FEV6FVC-%Pred-Post: 105 %
FEV6FVC-%Pred-Pre: 104 %
FVC-%Change-Post: 1 %
FVC-%Pred-Post: 94 %
FVC-%Pred-Pre: 93 %
FVC-Post: 4.26 L
FVC-Pre: 4.2 L
Post FEV1/FVC ratio: 85 %
Post FEV6/FVC ratio: 100 %
Pre FEV1/FVC ratio: 83 %
Pre FEV6/FVC Ratio: 99 %
RV % pred: 113 %
RV: 2.54 L
TLC % pred: 100 %
TLC: 6.82 L

## 2018-04-12 LAB — PROTIME-INR
INR: 1 (ref 0.8–1.2)
Prothrombin Time: 12.6 seconds (ref 11.4–15.2)

## 2018-04-12 LAB — CBC
HCT: 42 % (ref 39.0–52.0)
Hemoglobin: 14.1 g/dL (ref 13.0–17.0)
MCH: 31.5 pg (ref 26.0–34.0)
MCHC: 33.6 g/dL (ref 30.0–36.0)
MCV: 94 fL (ref 80.0–100.0)
Platelets: 159 10*3/uL (ref 150–400)
RBC: 4.47 MIL/uL (ref 4.22–5.81)
RDW: 12.5 % (ref 11.5–15.5)
WBC: 6.8 10*3/uL (ref 4.0–10.5)
nRBC: 0 % (ref 0.0–0.2)

## 2018-04-12 LAB — BASIC METABOLIC PANEL
Anion gap: 8 (ref 5–15)
BUN: 10 mg/dL (ref 8–23)
CO2: 25 mmol/L (ref 22–32)
Calcium: 9.1 mg/dL (ref 8.9–10.3)
Chloride: 106 mmol/L (ref 98–111)
Creatinine, Ser: 0.72 mg/dL (ref 0.61–1.24)
GFR calc Af Amer: >60 mL/min (ref >60)
Glucose, Bld: 108 mg/dL — ABNORMAL HIGH (ref 70–99)
Potassium: 4 mmol/L (ref 3.5–5.1)
SODIUM: 139 mmol/L (ref 135–145)

## 2018-04-12 LAB — HEMOGLOBIN A1C
Hgb A1c MFr Bld: 6.2 % — ABNORMAL HIGH (ref 4.8–5.6)
Mean Plasma Glucose: 131.24 mg/dL

## 2018-04-12 LAB — TSH: TSH: 2.736 u[IU]/mL (ref 0.350–4.500)

## 2018-04-12 LAB — ECHOCARDIOGRAM COMPLETE
Height: 69 in
Weight: 3171.2 oz

## 2018-04-12 MED ORDER — ALBUTEROL SULFATE (2.5 MG/3ML) 0.083% IN NEBU
2.5000 mg | INHALATION_SOLUTION | Freq: Once | RESPIRATORY_TRACT | Status: AC
  Administered 2018-04-12: 2.5 mg via RESPIRATORY_TRACT

## 2018-04-12 NOTE — Procedures (Signed)
Echo attempted again. Patient not in room. 

## 2018-04-12 NOTE — Procedures (Signed)
Echo attempted. Patient not in room. 

## 2018-04-12 NOTE — Progress Notes (Signed)
Pre CABG evaluation completed. Please see preliminary notes on CV PROC under chart review. Eddie Nelson H Eddie Nelson(RDMS RVT) 04/12/18 11:57 AM

## 2018-04-12 NOTE — Progress Notes (Signed)
CARDIAC REHAB PHASE I   Offered to walk with pt, pt declining at this time. Preop education completed with pt. Pt with lots of questions. Pt given IS, demonstrates >2250, [facility] care guide, Cardiac Surgery booklet, and in-the-tube sheet. Will continue to follow and encourage ambulation.  [id number] Reynold Bowen, RN BSN 04/12/2018 10:32 AM

## 2018-04-12 NOTE — Progress Notes (Addendum)
Progress Note  Patient Name: Eddie Nelson Date of Encounter: 04/12/2018  Primary Cardiologist: Garwin Brothers, MD   Subjective   No complaints.   Inpatient Medications    Scheduled Meds: . amLODipine  5 mg Oral Daily  . aspirin  81 mg Oral Daily  . atorvastatin  80 mg Oral q1800  . heparin  5,000 Units Subcutaneous Q8H  . metoprolol tartrate  12.5 mg Oral BID  . sodium chloride flush  3 mL Intravenous Q12H   Continuous Infusions: . sodium chloride 100 mL/hr at 04/12/18 0500  . sodium chloride     PRN Meds: sodium chloride, acetaminophen, cyclobenzaprine, diazepam, HYDROcodone-acetaminophen, ondansetron (ZOFRAN) IV, sodium chloride flush   Vital Signs    Vitals:   04/12/18 0025 04/12/18 0032 04/12/18 0403 04/12/18 0728  BP: 126/73 (!) 107/54 127/78 131/77  Pulse: (!) 58 64 63 66  Resp:   18 18  Temp: 98.6 F (37 C) (!) 97.4 F (36.3 C) 97.9 F (36.6 C) 97.8 F (36.6 C)  TempSrc: Oral Oral Oral Oral  SpO2: 96% 96% 97% 99%  Weight:   89.9 kg   Height:        Intake/Output Summary (Last 24 hours) at 04/12/2018 0940 Last data filed at 04/12/2018 6967 Gross per 24 hour  Intake 2189 ml  Output 800 ml  Net 1389 ml   Last 3 Weights 04/12/2018 04/11/2018 04/10/2018  Weight (lbs) 198 lb 3.2 oz 200 lb 201 lb  Weight (kg) 89.903 kg 90.719 kg 91.173 kg      Telemetry    NSR- Personally Reviewed  ECG    Not performed today- Personally Reviewed  Physical Exam   GEN: No acute distress.   Neck: No JVD Cardiac: RRR, no murmurs, rubs, or gallops.  Respiratory: Clear to auscultation bilaterally. GI: Soft, nontender, non-distended  MS: No edema; No deformity. Neuro:  Nonfocal  Psych: Normal affect   Labs    Chemistry Recent Labs  Lab 04/10/18 1223 04/11/18 1626 04/12/18 0519  NA 139  --  139  K 4.4  --  4.0  CL 101  --  106  CO2 22  --  25  GLUCOSE 97  --  108*  BUN 22  --  10  CREATININE 0.81 0.73 0.72  CALCIUM 9.6  --  9.1  PROT 7.1  --    --   ALBUMIN 4.7  --   --   AST 15  --   --   ALT 27  --   --   ALKPHOS 35*  --   --   BILITOT 0.4  --   --   GFRNONAA 95 >60 >60  GFRAA 109 >60 >60  ANIONGAP  --   --  8     Hematology Recent Labs  Lab 04/10/18 1223 04/11/18 1626 04/12/18 0519  WBC 7.6 7.9 6.8  RBC 4.85 4.52 4.47  HGB 15.2 14.0 14.1  HCT 44.3 41.3 42.0  MCV 91 91.4 94.0  MCH 31.3 31.0 31.5  MCHC 34.3 33.9 33.6  RDW 13.0 12.3 12.5  PLT 199 165 159    Cardiac EnzymesNo results for input(s): TROPONINI in the last 168 hours. No results for input(s): TROPIPOC in the last 168 hours.   BNPNo results for input(s): BNP, PROBNP in the last 168 hours.   DDimer No results for input(s): DDIMER in the last 168 hours.   Radiology    No results found.  Cardiac Studies   Procedures  LEFT HEART CATH AND CORONARY ANGIOGRAPHY  Conclusion     Prox RCA lesion is 80% stenosed.  Mid RCA lesion is 95% stenosed.  Ost RPDA to RPDA lesion is 99% stenosed.  Prox Cx lesion is 50% stenosed.  Ost 1st Mrg lesion is 90% stenosed.  Prox LAD to Mid LAD lesion is 95% stenosed.  The left ventricular ejection fraction is 50-55% by visual estimate.   Severe multivessel coronary obstructive disease with subtotal stenosis of the mid LAD between the second and third diagonal vessels; 50 and 95% stenosis of the proximal circumflex extending into a large OM1 branch which supplies the apex; and large dominant RCA with 80% proximal stenosis, 95% mid stenosis, patent and filling of the distal RCA with possible subtotal occlusion of the proximal PDA with both right to PDA and LAD to PDA retrograde collaterals.  Normal LV function with an EF of 50%.  There is focal mild mid inferior hypocontractility and a suggestion of possible mild focal anterolateral hypocontractility.  LVEDP is 17 mm.  RECOMMENDATION: Surgical consultation will be obtained for CABG revascularization surgery.  Patient has severe multivessel CAD with  excellent targets for bypass surgery.  We will keep him overnight so that this process can be initiated and will initiate anti-ischemic medical therapy.  The patient will initially be treated with aspirin.  High potency statin therapy with target LDL less than 70.    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 He was recently referred to Dr. Tomie China for preoperative assessment prior to undergoing back surgery. His ECG was abnormal. A nuclear stress test revealed ischemia in the inferior, inferoseptal and apical region. He was referred for preoperative definitive cardiac catheterization. Cath yesterday showed severe multivessel CAD. Plan is for CABG 04/15/18.   Assessment & Plan    1. CAD: multivessel CAD noted on cath yesterday. CABG recommended. Tentatively scheduled for 04/15/18. Continue medical management w/ ASA,  blocker, and statin. Also on amlodipine. Will add 30 mg of Imdur daily. 2D echo pending. He is CP free. VSS. Renal function and H/H stable post cath. Given lack of CP currently, he may be able to be discharged home and return next week for CABG. Will need to check w/ CT surgery.   2. HLD: severely elevated LDL at 230 mg/dl. Continue high intensity statin. May ultimately need addition of PCSK9 inhibitor to get LDL < 70 mg/dL. We can f/u labs post CABG and refer to lipid clinic.   For questions or updates, please contact [facility] Please consult [email or web address] for contact info under        Signed, Robbie Lis, PA-C  04/12/2018, 9:40 AM    I have examined the patient and reviewed assessment and plan and discussed with patient.  Agree with above as stated.  No angina.  Will let him go home today, to return Monday morning for CABG.  Will get specific instructions for his return Monday AM.    Hyperlipidemia will need management postoperatively.    I counseled him to avoid any strenuous activity until his  surgery.  Will add isosorbide as well.   Lance Muss   Addendum: Got word from CT surgery that the patient was to stay in the hospital over the weekend for necessary preoperative testing.  Still planning for surgery on Monday morning.  Corky Crafts, MD

## 2018-04-12 NOTE — Progress Notes (Signed)
  Echocardiogram 2D Echocardiogram has been performed.  Gerda Diss 04/12/2018, 2:37 PM

## 2018-04-13 LAB — HEPATIC FUNCTION PANEL
ALT: 27 U/L (ref 0–44)
AST: 16 U/L (ref 15–41)
Albumin: 3.9 g/dL (ref 3.5–5.0)
Alkaline Phosphatase: 32 U/L — ABNORMAL LOW (ref 38–126)
Bilirubin, Direct: 0.1 mg/dL (ref 0.0–0.2)
Indirect Bilirubin: 0.5 mg/dL (ref 0.3–0.9)
Total Bilirubin: 0.6 mg/dL (ref 0.3–1.2)
Total Protein: 6.6 g/dL (ref 6.5–8.1)

## 2018-04-13 LAB — HEMOGLOBIN A1C
Hgb A1c MFr Bld: 6.3 % — ABNORMAL HIGH (ref 4.8–5.6)
Mean Plasma Glucose: 134.11 mg/dL

## 2018-04-13 LAB — PROTIME-INR
INR: 0.9 (ref 0.8–1.2)
Prothrombin Time: 12.5 seconds (ref 11.4–15.2)

## 2018-04-13 LAB — TSH: TSH: 3.422 u[IU]/mL (ref 0.350–4.500)

## 2018-04-13 NOTE — Progress Notes (Signed)
Progress Note  Patient Name: Eddie Nelson Date of Encounter: 04/13/2018  Primary Cardiologist: Garwin Brothers, MD   Subjective   No CP or dyspnea  Inpatient Medications    Scheduled Meds: . amLODipine  5 mg Oral Daily  . aspirin  81 mg Oral Daily  . atorvastatin  80 mg Oral q1800  . heparin  5,000 Units Subcutaneous Q8H  . metoprolol tartrate  12.5 mg Oral BID  . sodium chloride flush  3 mL Intravenous Q12H   Continuous Infusions: . sodium chloride     PRN Meds: sodium chloride, acetaminophen, cyclobenzaprine, diazepam, HYDROcodone-acetaminophen, ondansetron (ZOFRAN) IV, sodium chloride flush   Vital Signs    Vitals:   04/12/18 0403 04/12/18 0728 04/12/18 1925 04/13/18 0611  BP: 127/78 131/77 127/76 112/85  Pulse: 63 66 72 66  Resp: Temp: 97.9 F (36.6 C) 97.8 F (36.6 C) 98.1 F (36.7 C) 98.4 F (36.9 C)  TempSrc: Oral Oral Oral Oral  SpO2: 97% 99% 95% 96%  Weight: 89.9 kg   88.4 kg  Height:        Intake/Output Summary (Last 24 hours) at 04/13/2018 0816 Last data filed at 04/13/2018 4098 Gross per 24 hour  Intake 1069.7 ml  Output 1925 ml  Net -855.3 ml   Last 3 Weights 04/13/2018 04/12/2018 04/11/2018  Weight (lbs) 194 lb 14.4 oz 198 lb 3.2 oz 200 lb  Weight (kg) 88.406 kg 89.903 kg 90.719 kg      Telemetry    Sinus - Personally Reviewed   Physical Exam   GEN: No acute distress.   Neck: No JVD Cardiac: RRR, no murmurs, rubs, or gallops.  Respiratory: Clear to auscultation bilaterally. GI: Soft, nontender, non-distended  MS: No edema Neuro:  Nonfocal  Psych: Normal affect   Labs    Chemistry Recent Labs  Lab 04/10/18 1223 04/11/18 1626 04/12/18 0519 04/13/18 0505  NA 139  --  139  --   K 4.4  --  4.0  --   CL 101  --  106  --   CO2 22  --  25  --   GLUCOSE 97  --  108*  --   BUN 22  --  10  --   CREATININE 0.81 0.73 0.72  --   CALCIUM 9.6  --  9.1  --   PROT 7.1  --   --  6.6  ALBUMIN 4.7  --   --  3.9    AST 15  --   --  16  ALT 27  --   --  27  ALKPHOS 35*  --   --  32*  BILITOT 0.4  --   --  0.6  GFRNONAA 95 >60 >60  --   GFRAA 109 >60 >60  --   ANIONGAP  --   --  8  --      Hematology Recent Labs  Lab 04/10/18 1223 04/11/18 1626 04/12/18 0519  WBC 7.6 7.9 6.8  RBC 4.85 4.52 4.47  HGB 15.2 14.0 14.1  HCT 44.3 41.3 42.0  MCV 91 91.4 94.0  MCH 31.3 31.0 31.5  MCHC 34.3 33.9 33.6  RDW 13.0 12.3 12.5  PLT 199 165 159    Radiology    Dg Chest 2 View  Result Date: 04/12/2018 CLINICAL DATA:  Pre CABG evaluation.  Ex-smoker. EXAM: CHEST - 2 VIEW COMPARISON:  03/17/2015. FINDINGS: Normal sized heart. Clear lungs. Minimal peribronchial thickening. Midthoracic spine  degenerative changes. IMPRESSION: No acute abnormality.  Minimal chronic bronchitic changes. Electronically Signed   By: Beckie Salts M.D.   On: 04/12/2018 12:03   Vas US Doppler Pre Cabg  Result Date: 04/12/2018 PREOPERATIVE VASCULAR EVALUATION  Indications:      Pre CABG. Risk Factors:     Hypertension. Limitations:      Multiple IV on site Comparison Study: No prior study available Performing Technologist: Melodie Bouillon RDMS, RVT  Examination Guidelines: A complete evaluation includes B-mode imaging, spectral Doppler, color Doppler, and power Doppler as needed of all accessible portions of each vessel. Bilateral testing is considered an integral part of a complete examination. Limited examinations for reoccurring indications may be performed as noted.  Right Carotid Findings: +----------+--------+-------+--------+----------------------+------------------+           PSV cm/sEDV    StenosisDescribe              Comments                             cm/s                                                    +----------+--------+-------+--------+----------------------+------------------+ CCA Prox  89      24             diffuse and                                                               hyperechoic                               +----------+--------+-------+--------+----------------------+------------------+ CCA Distal92      25             diffuse and           intimal thickening                                  hyperechoic                              +----------+--------+-------+--------+----------------------+------------------+ ICA Prox  71      25     1-39%   hyperechoic and focal                    +----------+--------+-------+--------+----------------------+------------------+ ICA Mid   110     37     40-59%                                           +----------+--------+-------+--------+----------------------+------------------+ ICA Distal106     40                                                      +----------+--------+-------+--------+----------------------+------------------+  ECA       129     15                                                      +----------+--------+-------+--------+----------------------+------------------+ Portions of this table do not appear on this page. +----------+--------+-------+--------+------------+           PSV cm/sEDV cmsDescribeArm Pressure +----------+--------+-------+--------+------------+ [id number]                                  +----------+--------+-------+--------+------------+ +---------+--------+--+--------+--+---------+ VertebralPSV cm/s55EDV cm/s20Antegrade +---------+--------+--+--------+--+---------+ Left Carotid Findings: +----------+--------+--------+--------+-----------------------+--------+           PSV cm/sEDV cm/sStenosisDescribe               Comments +----------+--------+--------+--------+-----------------------+--------+ CCA Prox  94      19              diffuse and hyperechoic         +----------+--------+--------+--------+-----------------------+--------+ CCA Distal88      24              diffuse and hyperechoic          +----------+--------+--------+--------+-----------------------+--------+ ICA Prox  114     45      40-59%  diffuse and hyperechoic         +----------+--------+--------+--------+-----------------------+--------+ ICA Mid   99      37                                              +----------+--------+--------+--------+-----------------------+--------+ ICA Distal109     42                                              +----------+--------+--------+--------+-----------------------+--------+ ECA       92      14                                              +----------+--------+--------+--------+-----------------------+--------+ +----------+--------+--------+--------+------------+ SubclavianPSV cm/sEDV cm/sDescribeArm Pressure +----------+--------+--------+--------+------------+           125                     120          +----------+--------+--------+--------+------------+ +---------+--------+--+--------+--+----------------------------+ VertebralPSV cm/s47EDV cm/s17Antegrade and High resistant +---------+--------+--+--------+--+----------------------------+  ABI Findings: +--------+------------------+-----+---------+----------+ Right   Rt Pressure (mmHg)IndexWaveform Comment    +--------+------------------+-----+---------+----------+ Brachial                       triphasicIV on site +--------+------------------+-----+---------+----------+ PTA     121               1.01 triphasic           +--------+------------------+-----+---------+----------+ DP      136               1.13 triphasic           +--------+------------------+-----+---------+----------+ +--------+------------------+-----+---------+-------+ Left  Lt Pressure (mmHg)IndexWaveform Comment +--------+------------------+-----+---------+-------+ Brachial120                    triphasic        +--------+------------------+-----+---------+-------+ PTA     146               1.22  triphasic        +--------+------------------+-----+---------+-------+ DP      127               1.06 triphasic        +--------+------------------+-----+---------+-------+ +-------+---------------+----------------+ ABI/TBIToday's ABI/TBIPrevious ABI/TBI +-------+---------------+----------------+ Right  1.13                            +-------+---------------+----------------+ Left   1.22                            +-------+---------------+----------------+  Right Doppler Findings: +--------+--------+-----+---------+----------+ Site    PressureIndexDoppler  Comments   +--------+--------+-----+---------+----------+ Brachial             triphasicIV on site +--------+--------+-----+---------+----------+ Radial               triphasic           +--------+--------+-----+---------+----------+ Ulnar                triphasic           +--------+--------+-----+---------+----------+  Left Doppler Findings: +--------+--------+-----+---------+--------+ Site    PressureIndexDoppler  Comments +--------+--------+-----+---------+--------+ Brachial120  [id number]        triphasic         +--------+--------+-----+---------+--------+ Radial               triphasic         +--------+--------+-----+---------+--------+ Ulnar                triphasic         +--------+--------+-----+---------+--------+  Summary: Right Carotid: Velocities in the right ICA are consistent with a 40-59%                stenosis. Left Carotid: Velocities in the left ICA are consistent with a 40-59% stenosis. Vertebrals: Bilateral vertebral arteries demonstrate antegrade flow. Left             vertebral artery demonstrates high resistant flow. Right ABI: Resting right ankle-brachial index is within normal range. No evidence of significant right lower extremity arterial disease. Left ABI: Resting left ankle-brachial index is within normal range. No evidence of significant left lower extremity arterial  disease. Right Upper Extremity: Doppler waveforms remain within normal limits with right radial compression. Doppler waveforms decrease 50% with right ulnar compression. Left Upper Extremity: Doppler waveforms decrease <50% w left radial compression. Doppler waveform obliterate with left ulnar compression.  Electronically signed by Lemar LivingsBrandon Cain MD on 04/12/2018 at 12:08:58 PM.    Final    Cardiac Studies   Procedures   LEFT HEART CATH AND CORONARY ANGIOGRAPHY  Conclusion     Prox RCA lesion is 80% stenosed.  Mid RCA lesion is 95% stenosed.  Ost RPDA to RPDA lesion is 99% stenosed.  Prox Cx lesion is 50% stenosed.  Ost 1st Mrg lesion is 90% stenosed.  Prox LAD to Mid LAD lesion is 95% stenosed.  The left ventricular ejection fraction is 50-55% by visual estimate.  Severe multivessel coronary obstructive disease with subtotal stenosis of the mid LAD between the second and third diagonal vessels; 50 and  95% stenosis of the proximal circumflex extending into a large OM1 branch which supplies the apex; and large dominant RCA with 80% proximal stenosis, 95% mid stenosis, patent and filling of the distal RCA with possible subtotal occlusion of the proximal PDA with both right to PDA and LAD to PDA retrograde collaterals.  Normal LV function with an EF of 50%. There is focal mild mid inferior hypocontractility and a suggestion of possible mild focal anterolateral hypocontractility. LVEDP is 17 mm.  RECOMMENDATION: Surgical consultation will be obtained for CABG revascularization surgery. Patient has severe multivessel CAD with excellent targets for bypass surgery. We will keep him overnight so that this process can be initiated and will initiate anti-ischemic medical therapy. The patient will initially be treated with aspirin. High potency statin therapy with target LDL less than 70.     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 He was recently referred to Dr. Tomie China for preoperative assessment prior to undergoing back surgery. His ECG was abnormal. A nuclear stress test revealed ischemia in the inferior, inferoseptal and apical region. He was referred for preoperative definitive cardiac catheterization. Cath showed severe multivessel CAD. Plan is for CABG 04/15/18.   Assessment & Plan    1 Coronary artery disease-plan to continue aspirin and statin.  Patient is for coronary artery bypass and graft on March 2.  2 severe hyperlipidemia-continue statin.  He will need lipids and liver repeated in 6 weeks.  Can consider addition of Zetia or Repatha if LDL remains elevated despite statin.  3 carotid artery disease-40 to 59% bilateral stenosis.  Will need follow-up studies in the future.  4 hypertension-blood pressure is controlled.  Continue present medications and follow.  For questions or updates, please contact [facility] Please consult [email or web address] for contact info under        Signed, Olga Millers, MD  04/13/2018, 8:16 AM

## 2018-04-13 NOTE — Progress Notes (Signed)
PHASE I CARDIAC REHAB  Bedside visit to reinforce pre op education. Pt and family very appreciative of visit. Pt and family questions answered with a lot of information for what to expected day of surgery and plannning for home. Pt given pre cardiac surgery video infornation and will plan to watch after lunch.  Understanding verbalized.   [id number] Deveron Furlong, RN, BSN Cardiac Pulmonary Rehab

## 2018-04-14 LAB — ABO/RH: ABO/RH(D): B POS

## 2018-04-14 LAB — PREPARE RBC (CROSSMATCH)

## 2018-04-14 MED ORDER — PHENYLEPHRINE HCL-NACL 20-0.9 MG/250ML-% IV SOLN
30.0000 ug/min | INTRAVENOUS | Status: DC
  Filled 2018-04-14: qty 250

## 2018-04-14 MED ORDER — DIAZEPAM 5 MG PO TABS
5.0000 mg | ORAL_TABLET | Freq: Once | ORAL | Status: AC
  Administered 2018-04-15: 5 mg via ORAL
  Filled 2018-04-14: qty 1

## 2018-04-14 MED ORDER — CHLORHEXIDINE GLUCONATE 4 % EX LIQD
60.0000 mL | Freq: Once | CUTANEOUS | Status: AC
  Administered 2018-04-15: 4 via TOPICAL
  Filled 2018-04-14: qty 60

## 2018-04-14 MED ORDER — TRANEXAMIC ACID (OHS) PUMP PRIME SOLUTION
2.0000 mg/kg | INTRAVENOUS | Status: DC
  Filled 2018-04-14: qty 1.77

## 2018-04-14 MED ORDER — POTASSIUM CHLORIDE 2 MEQ/ML IV SOLN
80.0000 meq | INTRAVENOUS | Status: DC
  Filled 2018-04-14: qty 40

## 2018-04-14 MED ORDER — DEXMEDETOMIDINE HCL IN NACL 400 MCG/100ML IV SOLN
0.1000 ug/kg/h | INTRAVENOUS | Status: AC
  Administered 2018-04-15: .5 ug/kg/h via INTRAVENOUS
  Filled 2018-04-14 (×2): qty 100

## 2018-04-14 MED ORDER — MILRINONE LACTATE IN DEXTROSE 20-5 MG/100ML-% IV SOLN
0.3000 ug/kg/min | INTRAVENOUS | Status: DC
  Filled 2018-04-14: qty 100

## 2018-04-14 MED ORDER — CHLORHEXIDINE GLUCONATE 4 % EX LIQD
60.0000 mL | Freq: Once | CUTANEOUS | Status: AC
  Administered 2018-04-14: 4 via TOPICAL
  Filled 2018-04-14: qty 60

## 2018-04-14 MED ORDER — PLASMA-LYTE 148 IV SOLN
INTRAVENOUS | Status: AC
  Administered 2018-04-15: 500 mL
  Filled 2018-04-14: qty 2.5

## 2018-04-14 MED ORDER — VANCOMYCIN HCL 10 G IV SOLR
1500.0000 mg | INTRAVENOUS | Status: AC
  Administered 2018-04-15: 1500 mg via INTRAVENOUS
  Filled 2018-04-14: qty 1500

## 2018-04-14 MED ORDER — METOPROLOL TARTRATE 12.5 MG HALF TABLET
12.5000 mg | ORAL_TABLET | Freq: Once | ORAL | Status: AC
  Administered 2018-04-15: 12.5 mg via ORAL
  Filled 2018-04-14: qty 1

## 2018-04-14 MED ORDER — CHLORHEXIDINE GLUCONATE 0.12 % MT SOLN
15.0000 mL | Freq: Once | OROMUCOSAL | Status: AC
  Administered 2018-04-15: 15 mL via OROMUCOSAL
  Filled 2018-04-14: qty 15

## 2018-04-14 MED ORDER — SODIUM CHLORIDE 0.9 % IV SOLN
INTRAVENOUS | Status: DC
  Filled 2018-04-14: qty 30

## 2018-04-14 MED ORDER — EPINEPHRINE PF 1 MG/ML IJ SOLN
0.0000 ug/min | INTRAVENOUS | Status: DC
  Filled 2018-04-14: qty 4

## 2018-04-14 MED ORDER — INSULIN REGULAR(HUMAN) IN NACL 100-0.9 UT/100ML-% IV SOLN
INTRAVENOUS | Status: AC
  Administered 2018-04-15: 1 [IU]/h via INTRAVENOUS
  Filled 2018-04-14: qty 100

## 2018-04-14 MED ORDER — ALPRAZOLAM 0.25 MG PO TABS: 0.2500 mg | ORAL_TABLET | ORAL | Status: DC | PRN

## 2018-04-14 MED ORDER — DOPAMINE-DEXTROSE 3.2-5 MG/ML-% IV SOLN
0.0000 ug/kg/min | INTRAVENOUS | Status: DC
  Filled 2018-04-14 (×2): qty 250

## 2018-04-14 MED ORDER — SODIUM CHLORIDE 0.9 % IV SOLN
1.5000 g | INTRAVENOUS | Status: AC
  Administered 2018-04-15: 1.5 g via INTRAVENOUS
  Filled 2018-04-14: qty 1.5

## 2018-04-14 MED ORDER — BISACODYL 5 MG PO TBEC
5.0000 mg | DELAYED_RELEASE_TABLET | Freq: Once | ORAL | Status: AC
  Administered 2018-04-14: 5 mg via ORAL
  Filled 2018-04-14: qty 1

## 2018-04-14 MED ORDER — TRANEXAMIC ACID 1000 MG/10ML IV SOLN
1.5000 mg/kg/h | INTRAVENOUS | Status: AC
  Administered 2018-04-15: 1.5 mg/kg/h via INTRAVENOUS
  Filled 2018-04-14: qty 25

## 2018-04-14 MED ORDER — SODIUM CHLORIDE 0.9 % IV SOLN
750.0000 mg | INTRAVENOUS | Status: DC
  Filled 2018-04-14 (×2): qty 750

## 2018-04-14 MED ORDER — TRANEXAMIC ACID (OHS) BOLUS VIA INFUSION
15.0000 mg/kg | INTRAVENOUS | Status: AC
  Administered 2018-04-15: 1326 mg via INTRAVENOUS
  Filled 2018-04-14: qty 1326

## 2018-04-14 MED ORDER — TEMAZEPAM 15 MG PO CAPS: 15.0000 mg | ORAL_CAPSULE | Freq: Once | ORAL | Status: DC | PRN

## 2018-04-14 MED ORDER — MAGNESIUM SULFATE 50 % IJ SOLN
40.0000 meq | INTRAMUSCULAR | Status: DC
  Filled 2018-04-14: qty 9.85

## 2018-04-14 MED ORDER — NITROGLYCERIN IN D5W 200-5 MCG/ML-% IV SOLN
2.0000 ug/min | INTRAVENOUS | Status: DC
  Filled 2018-04-14: qty 250

## 2018-04-14 NOTE — Anesthesia Preprocedure Evaluation (Addendum)
Anesthesia Evaluation  Patient identified by MRN, date of birth, ID band Patient awake    Reviewed: Allergy & Precautions, H&P , NPO status , Patient's Chart, lab work & pertinent test results  Airway Mallampati: III  TM Distance: >3 FB Neck ROM: Full    Dental no notable dental hx. (+) Teeth Intact, Dental Advisory Given   Pulmonary neg pulmonary ROS, former smoker,    Pulmonary exam normal breath sounds clear to auscultation       Cardiovascular Exercise Tolerance: Good hypertension, Pt. on medications + CAD   Rhythm:Regular Rate:Normal     Neuro/Psych negative neurological ROS  negative psych ROS   GI/Hepatic negative GI ROS, Neg liver ROS,   Endo/Other  negative endocrine ROS  Renal/GU negative Renal ROS  negative genitourinary   Musculoskeletal  (+) Arthritis , Osteoarthritis,    Abdominal   Peds  Hematology negative hematology ROS (+)   Anesthesia Other Findings   Reproductive/Obstetrics negative OB ROS                            Anesthesia Physical Anesthesia Plan  ASA: IV  Anesthesia Plan: General   Post-op Pain Management:    Induction: Intravenous  PONV Risk Score and Plan: 2 and Midazolam and Treatment may vary due to age or medical condition  Airway Management Planned: Oral ETT  Additional Equipment: Arterial line, CVP, PA Cath, TEE and Ultrasound Guidance Line Placement  Intra-op Plan:   Post-operative Plan: Post-operative intubation/ventilation  Informed Consent: I have reviewed the patients History and Physical, chart, labs and discussed the procedure including the risks, benefits and alternatives for the proposed anesthesia with the patient or authorized representative who has indicated his/her understanding and acceptance.     Dental advisory given  Plan Discussed with: CRNA  Anesthesia Plan Comments:         Anesthesia Quick Evaluation

## 2018-04-14 NOTE — Progress Notes (Signed)
Progress Note  Patient Name: Eddie Nelson Date of Encounter: 04/14/2018  Primary Cardiologist: Garwin Brothers, MD   Subjective   Pt denies CP or dyspnea  Inpatient Medications    Scheduled Meds: . amLODipine  5 mg Oral Daily  . aspirin  81 mg Oral Daily  . atorvastatin  80 mg Oral q1800  . heparin  5,000 Units Subcutaneous Q8H  . metoprolol tartrate  12.5 mg Oral BID  . sodium chloride flush  3 mL Intravenous Q12H   Continuous Infusions: . sodium chloride     PRN Meds: sodium chloride, acetaminophen, cyclobenzaprine, diazepam, HYDROcodone-acetaminophen, ondansetron (ZOFRAN) IV, sodium chloride flush   Vital Signs    Vitals:   04/13/18 1146 04/13/18 1643 04/13/18 2024 04/14/18 0556  BP: 133/84 112/78 116/74 116/75  Pulse: 76 86 77 65  Resp: 20 18 16 16   Temp: 98.6 F (37 C) 99.1 F (37.3 C) 98.2 F (36.8 C) 98.6 F (37 C)  TempSrc: Oral Oral Oral Oral  SpO2: 99% 94% 97% 95%  Weight:    88.4 kg  Height:        Intake/Output Summary (Last 24 hours) at 04/14/2018 0623 Last data filed at 04/14/2018 0600 Gross per 24 hour  Intake 243 ml  Output 1900 ml  Net -1657 ml   Last 3 Weights 04/14/2018 04/13/2018 04/12/2018  Weight (lbs) 194 lb 14.4 oz 194 lb 14.4 oz 198 lb 3.2 oz  Weight (kg) 88.406 kg 88.406 kg 89.903 kg      Telemetry    Sinus rhythm - Personally Reviewed   Physical Exam   GEN: No acute distress.  WD WN Neck: No JVD, supple Cardiac: RRR Respiratory: CTA GI: Soft, NT/ND MS: No edema Neuro:  Grossly intact   Labs    Chemistry Recent Labs  Lab 04/10/18 1223 04/11/18 1626 04/12/18 0519 04/13/18 0505  NA 139  --  139  --   K 4.4  --  4.0  --   CL 101  --  106  --   CO2 22  --  25  --   GLUCOSE 97  --  108*  --   BUN 22  --  10  --   CREATININE 0.81 0.73 0.72  --   CALCIUM 9.6  --  9.1  --   PROT 7.1  --   --  6.6  ALBUMIN 4.7  --   --  3.9  AST 15  --   --  16  ALT 27  --   --  27  ALKPHOS 35*  --   --  32*  BILITOT 0.4   --   --  0.6  GFRNONAA 95 >60 >60  --   GFRAA 109 >60 >60  --   ANIONGAP  --   --  8  --      Hematology Recent Labs  Lab 04/10/18 1223 04/11/18 1626 04/12/18 0519  WBC 7.6 7.9 6.8  RBC 4.85 4.52 4.47  HGB 15.2 14.0 14.1  HCT 44.3 41.3 42.0  MCV 91 91.4 94.0  MCH 31.3 31.0 31.5  MCHC 34.3 33.9 33.6  RDW 13.0 12.3 12.5  PLT 199 165 159    Radiology    Dg Chest 2 View  Result Date: 04/12/2018 CLINICAL DATA:  Pre CABG evaluation.  Ex-smoker. EXAM: CHEST - 2 VIEW COMPARISON:  03/17/2015. FINDINGS: Normal sized heart. Clear lungs. Minimal peribronchial thickening. Midthoracic spine degenerative changes. IMPRESSION: No acute abnormality.  Minimal chronic bronchitic  changes. Electronically Signed   By: Beckie Salts M.D.   On: 04/12/2018 12:03   Vas US Doppler Pre Cabg  Result Date: 04/12/2018 PREOPERATIVE VASCULAR EVALUATION  Indications:      Pre CABG. Risk Factors:     Hypertension. Limitations:      Multiple IV on site Comparison Study: No prior study available Performing Technologist: Melodie Bouillon RDMS, RVT  Examination Guidelines: A complete evaluation includes B-mode imaging, spectral Doppler, color Doppler, and power Doppler as needed of all accessible portions of each vessel. Bilateral testing is considered an integral part of a complete examination. Limited examinations for reoccurring indications may be performed as noted.  Right Carotid Findings: +----------+--------+-------+--------+----------------------+------------------+           PSV cm/sEDV    StenosisDescribe              Comments                             cm/s                                                    +----------+--------+-------+--------+----------------------+------------------+ CCA Prox  89      24             diffuse and                                                               hyperechoic                               +----------+--------+-------+--------+----------------------+------------------+ CCA Distal92      25             diffuse and           intimal thickening                                  hyperechoic                              +----------+--------+-------+--------+----------------------+------------------+ ICA Prox  71      25     1-39%   hyperechoic and focal                    +----------+--------+-------+--------+----------------------+------------------+ ICA Mid   110     37     40-59%                                           +----------+--------+-------+--------+----------------------+------------------+ ICA Distal106     40                                                      +----------+--------+-------+--------+----------------------+------------------+  ECA       129     15                                                      +----------+--------+-------+--------+----------------------+------------------+ Portions of this table do not appear on this page. +----------+--------+-------+--------+------------+           PSV cm/sEDV cmsDescribeArm Pressure +----------+--------+-------+--------+------------+ [id number]                                  +----------+--------+-------+--------+------------+ +---------+--------+--+--------+--+---------+ VertebralPSV cm/s55EDV cm/s20Antegrade +---------+--------+--+--------+--+---------+ Left Carotid Findings: +----------+--------+--------+--------+-----------------------+--------+           PSV cm/sEDV cm/sStenosisDescribe               Comments +----------+--------+--------+--------+-----------------------+--------+ CCA Prox  94      19              diffuse and hyperechoic         +----------+--------+--------+--------+-----------------------+--------+ CCA Distal88      24              diffuse and hyperechoic          +----------+--------+--------+--------+-----------------------+--------+ ICA Prox  114     45      40-59%  diffuse and hyperechoic         +----------+--------+--------+--------+-----------------------+--------+ ICA Mid   99      37                                              +----------+--------+--------+--------+-----------------------+--------+ ICA Distal109     42                                              +----------+--------+--------+--------+-----------------------+--------+ ECA       92      14                                              +----------+--------+--------+--------+-----------------------+--------+ +----------+--------+--------+--------+------------+ SubclavianPSV cm/sEDV cm/sDescribeArm Pressure +----------+--------+--------+--------+------------+           125                     120          +----------+--------+--------+--------+------------+ +---------+--------+--+--------+--+----------------------------+ VertebralPSV cm/s47EDV cm/s17Antegrade and High resistant +---------+--------+--+--------+--+----------------------------+  ABI Findings: +--------+------------------+-----+---------+----------+ Right   Rt Pressure (mmHg)IndexWaveform Comment    +--------+------------------+-----+---------+----------+ Brachial                       triphasicIV on site +--------+------------------+-----+---------+----------+ PTA     121               1.01 triphasic           +--------+------------------+-----+---------+----------+ DP      136               1.13 triphasic           +--------+------------------+-----+---------+----------+ +--------+------------------+-----+---------+-------+ Left  Lt Pressure (mmHg)IndexWaveform Comment +--------+------------------+-----+---------+-------+ Brachial120                    triphasic        +--------+------------------+-----+---------+-------+ PTA     146               1.22  triphasic        +--------+------------------+-----+---------+-------+ DP      127               1.06 triphasic        +--------+------------------+-----+---------+-------+ +-------+---------------+----------------+ ABI/TBIToday's ABI/TBIPrevious ABI/TBI +-------+---------------+----------------+ Right  1.13                            +-------+---------------+----------------+ Left   1.22                            +-------+---------------+----------------+  Right Doppler Findings: +--------+--------+-----+---------+----------+ Site    PressureIndexDoppler  Comments   +--------+--------+-----+---------+----------+ Brachial             triphasicIV on site +--------+--------+-----+---------+----------+ Radial               triphasic           +--------+--------+-----+---------+----------+ Ulnar                triphasic           +--------+--------+-----+---------+----------+  Left Doppler Findings: +--------+--------+-----+---------+--------+ Site    PressureIndexDoppler  Comments +--------+--------+-----+---------+--------+ Brachial120  [id number]        triphasic         +--------+--------+-----+---------+--------+ Radial               triphasic         +--------+--------+-----+---------+--------+ Ulnar                triphasic         +--------+--------+-----+---------+--------+  Summary: Right Carotid: Velocities in the right ICA are consistent with a 40-59%                stenosis. Left Carotid: Velocities in the left ICA are consistent with a 40-59% stenosis. Vertebrals: Bilateral vertebral arteries demonstrate antegrade flow. Left             vertebral artery demonstrates high resistant flow. Right ABI: Resting right ankle-brachial index is within normal range. No evidence of significant right lower extremity arterial disease. Left ABI: Resting left ankle-brachial index is within normal range. No evidence of significant left lower extremity arterial  disease. Right Upper Extremity: Doppler waveforms remain within normal limits with right radial compression. Doppler waveforms decrease 50% with right ulnar compression. Left Upper Extremity: Doppler waveforms decrease <50% w left radial compression. Doppler waveform obliterate with left ulnar compression.  Electronically signed by Lemar LivingsBrandon Cain MD on 04/12/2018 at 12:08:58 PM.    Final    Cardiac Studies   Procedures   LEFT HEART CATH AND CORONARY ANGIOGRAPHY  Conclusion     Prox RCA lesion is 80% stenosed.  Mid RCA lesion is 95% stenosed.  Ost RPDA to RPDA lesion is 99% stenosed.  Prox Cx lesion is 50% stenosed.  Ost 1st Mrg lesion is 90% stenosed.  Prox LAD to Mid LAD lesion is 95% stenosed.  The left ventricular ejection fraction is 50-55% by visual estimate.  Severe multivessel coronary obstructive disease with subtotal stenosis of the mid LAD between the second and third diagonal vessels; 50 and  95% stenosis of the proximal circumflex extending into a large OM1 branch which supplies the apex; and large dominant RCA with 80% proximal stenosis, 95% mid stenosis, patent and filling of the distal RCA with possible subtotal occlusion of the proximal PDA with both right to PDA and LAD to PDA retrograde collaterals.  Normal LV function with an EF of 50%. There is focal mild mid inferior hypocontractility and a suggestion of possible mild focal anterolateral hypocontractility. LVEDP is 17 mm.  RECOMMENDATION: Surgical consultation will be obtained for CABG revascularization surgery. Patient has severe multivessel CAD with excellent targets for bypass surgery. We will keep him overnight so that this process can be initiated and will initiate anti-ischemic medical therapy. The patient will initially be treated with aspirin. High potency statin therapy with target LDL less than 70.     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 He was recently referred to Dr. Tomie China for preoperative assessment prior to undergoing back surgery. His ECG was abnormal. A nuclear stress test revealed ischemia in the inferior, inferoseptal and apical region. He was referred for preoperative definitive cardiac catheterization. Cath showed severe multivessel CAD. Plan is for CABG 04/15/18.   Assessment & Plan    1 Coronary artery disease-continue present medications including aspirin and Lipitor. Patient is for coronary artery bypass and graft on March 2.  2 severe hyperlipidemia-continue Lipitor.  Check lipids and liver in 6 weeks.  Can consider addition of Zetia or Repatha if LDL remains elevated despite statin.  3 carotid artery disease-40 to 59% bilateral stenosis.  Will need follow-up studies February 2021.  4 hypertension-blood pressure is controlled.  Continue present medications and follow.  For questions or updates, please contact [facility] Please consult [email or web address] for contact info under        Signed, Olga Millers, MD  04/14/2018, 6:23 AM

## 2018-04-15 ENCOUNTER — Inpatient Hospital Stay (HOSPITAL_COMMUNITY)
Admission: AD | Disposition: A | Discharge status: Home Health | Payer: Self-pay | Source: Home / Self Care | Attending: Cardiothoracic Surgery

## 2018-04-15 ENCOUNTER — Encounter (HOSPITAL_COMMUNITY): Payer: Self-pay | Admitting: Certified Registered Nurse Anesthetist

## 2018-04-15 ENCOUNTER — Inpatient Hospital Stay (HOSPITAL_COMMUNITY): Payer: PRIVATE HEALTH INSURANCE | Admitting: Certified Registered Nurse Anesthetist

## 2018-04-15 ENCOUNTER — Inpatient Hospital Stay (HOSPITAL_COMMUNITY): Payer: PRIVATE HEALTH INSURANCE

## 2018-04-15 DIAGNOSIS — Z951 Presence of aortocoronary bypass graft: Secondary | ICD-10-CM

## 2018-04-15 HISTORY — DX: Presence of aortocoronary bypass graft: Z95.1

## 2018-04-15 HISTORY — PX: TEE WITHOUT CARDIOVERSION: SHX5443

## 2018-04-15 HISTORY — PX: CORONARY ARTERY BYPASS GRAFT: SHX141

## 2018-04-15 LAB — CBC
HCT: 43.6 % (ref 39.0–52.0)
HCT: 31.5 % — ABNORMAL LOW (ref 39.0–52.0)
HCT: 32.6 % — ABNORMAL LOW (ref 39.0–52.0)
Hemoglobin: 14.9 g/dL (ref 13.0–17.0)
Hemoglobin: 10.7 g/dL — ABNORMAL LOW (ref 13.0–17.0)
Hemoglobin: 11.2 g/dL — ABNORMAL LOW (ref 13.0–17.0)
MCH: 31.2 pg (ref 26.0–34.0)
MCH: 30.9 pg (ref 26.0–34.0)
MCH: 31.6 pg (ref 26.0–34.0)
MCHC: 34.2 g/dL (ref 30.0–36.0)
MCHC: 34 g/dL (ref 30.0–36.0)
MCHC: 34.4 g/dL (ref 30.0–36.0)
MCV: 91.2 fL (ref 80.0–100.0)
MCV: 91 fL (ref 80.0–100.0)
MCV: 92.1 fL (ref 80.0–100.0)
Platelets: 187 10*3/uL (ref 150–400)
Platelets: 116 10*3/uL — ABNORMAL LOW (ref 150–400)
Platelets: 141 10*3/uL — ABNORMAL LOW (ref 150–400)
RBC: 4.78 MIL/uL (ref 4.22–5.81)
RBC: 3.46 MIL/uL — ABNORMAL LOW (ref 4.22–5.81)
RBC: 3.54 MIL/uL — ABNORMAL LOW (ref 4.22–5.81)
RDW: 12.2 % (ref 11.5–15.5)
RDW: 12.1 % (ref 11.5–15.5)
RDW: 12.2 % (ref 11.5–15.5)
WBC: 6.8 10*3/uL (ref 4.0–10.5)
WBC: 8.4 10*3/uL (ref 4.0–10.5)
WBC: 8.7 10*3/uL (ref 4.0–10.5)
nRBC: 0 % (ref 0.0–0.2)
nRBC: 0 % (ref 0.0–0.2)
nRBC: 0 % (ref 0.0–0.2)

## 2018-04-15 LAB — CREATININE, SERUM
Creatinine, Ser: 0.79 mg/dL (ref 0.61–1.24)
GFR calc Af Amer: >60 mL/min (ref >60)
GFR calc non Af Amer: >60 mL/min (ref >60)

## 2018-04-15 LAB — POCT I-STAT 7, (LYTES, BLD GAS, ICA,H+H)
ACID-BASE EXCESS: 2 mmol/L (ref 0.0–2.0)
Acid-Base Excess: 7 mmol/L — ABNORMAL HIGH (ref 0.0–2.0)
Acid-base deficit: 1 mmol/L (ref 0.0–2.0)
Acid-base deficit: 1 mmol/L (ref 0.0–2.0)
Acid-base deficit: 1 mmol/L (ref 0.0–2.0)
Acid-base deficit: 1 mmol/L (ref 0.0–2.0)
Bicarbonate: 28.5 mmol/L — ABNORMAL HIGH (ref 20.0–28.0)
Bicarbonate: 30.4 mmol/L — ABNORMAL HIGH (ref 20.0–28.0)
Bicarbonate: 23.8 mmol/L (ref 20.0–28.0)
Bicarbonate: 24.4 mmol/L (ref 20.0–28.0)
Bicarbonate: 23.8 mmol/L (ref 20.0–28.0)
Bicarbonate: 24.9 mmol/L (ref 20.0–28.0)
CALCIUM ION: 0.92 mmol/L — AB (ref 1.15–1.40)
Calcium, Ion: 1.26 mmol/L (ref 1.15–1.40)
Calcium, Ion: 1.12 mmol/L — ABNORMAL LOW (ref 1.15–1.40)
Calcium, Ion: 1.16 mmol/L (ref 1.15–1.40)
Calcium, Ion: 1.16 mmol/L (ref 1.15–1.40)
Calcium, Ion: 1.18 mmol/L (ref 1.15–1.40)
HCT: 40 % (ref 39.0–52.0)
HCT: 24 % — ABNORMAL LOW (ref 39.0–52.0)
HCT: 29 % — ABNORMAL LOW (ref 39.0–52.0)
HCT: 26 % — ABNORMAL LOW (ref 39.0–52.0)
HCT: 29 % — ABNORMAL LOW (ref 39.0–52.0)
HCT: 31 % — ABNORMAL LOW (ref 39.0–52.0)
Hemoglobin: 13.6 g/dL (ref 13.0–17.0)
Hemoglobin: 8.2 g/dL — ABNORMAL LOW (ref 13.0–17.0)
Hemoglobin: 9.9 g/dL — ABNORMAL LOW (ref 13.0–17.0)
Hemoglobin: 8.8 g/dL — ABNORMAL LOW (ref 13.0–17.0)
Hemoglobin: 9.9 g/dL — ABNORMAL LOW (ref 13.0–17.0)
Hemoglobin: 10.5 g/dL — ABNORMAL LOW (ref 13.0–17.0)
O2 SAT: 99 %
O2 Saturation: 100 %
O2 Saturation: 100 %
O2 Saturation: 96 %
O2 Saturation: 98 %
O2 Saturation: 97 %
PO2 ART: 381 mmHg — AB (ref 83.0–108.0)
Patient temperature: 35.8
Patient temperature: 37.4
Patient temperature: 37.6
Potassium: 4.2 mmol/L (ref 3.5–5.1)
Potassium: 4.3 mmol/L (ref 3.5–5.1)
Potassium: 3.8 mmol/L (ref 3.5–5.1)
Potassium: 3.8 mmol/L (ref 3.5–5.1)
Potassium: 4.6 mmol/L (ref 3.5–5.1)
Potassium: 4.1 mmol/L (ref 3.5–5.1)
SODIUM: 140 mmol/L (ref 135–145)
Sodium: 139 mmol/L (ref 135–145)
Sodium: 140 mmol/L (ref 135–145)
Sodium: 141 mmol/L (ref 135–145)
Sodium: 140 mmol/L (ref 135–145)
Sodium: 140 mmol/L (ref 135–145)
TCO2: 30 mmol/L (ref 22–32)
TCO2: 32 mmol/L (ref 22–32)
TCO2: 25 mmol/L (ref 22–32)
TCO2: 26 mmol/L (ref 22–32)
TCO2: 25 mmol/L (ref 22–32)
TCO2: 26 mmol/L (ref 22–32)
pCO2 arterial: 49.6 mmHg — ABNORMAL HIGH (ref 32.0–48.0)
pCO2 arterial: 38.7 mmHg (ref 32.0–48.0)
pCO2 arterial: 38.1 mmHg (ref 32.0–48.0)
pCO2 arterial: 42.6 mmHg (ref 32.0–48.0)
pCO2 arterial: 38.7 mmHg (ref 32.0–48.0)
pCO2 arterial: 47.3 mmHg (ref 32.0–48.0)
pH, Arterial: 7.367 (ref 7.350–7.450)
pH, Arterial: 7.503 — ABNORMAL HIGH (ref 7.350–7.450)
pH, Arterial: 7.404 (ref 7.350–7.450)
pH, Arterial: 7.361 (ref 7.350–7.450)
pH, Arterial: 7.399 (ref 7.350–7.450)
pH, Arterial: 7.333 — ABNORMAL LOW (ref 7.350–7.450)
pO2, Arterial: 368 mmHg — ABNORMAL HIGH (ref 83.0–108.0)
pO2, Arterial: 148 mmHg — ABNORMAL HIGH (ref 83.0–108.0)
pO2, Arterial: 77 mmHg — ABNORMAL LOW (ref 83.0–108.0)
pO2, Arterial: 102 mmHg (ref 83.0–108.0)
pO2, Arterial: 104 mmHg (ref 83.0–108.0)

## 2018-04-15 LAB — POCT I-STAT 4, (NA,K, GLUC, HGB,HCT)
Glucose, Bld: 108 mg/dL — ABNORMAL HIGH (ref 70–99)
Glucose, Bld: 102 mg/dL — ABNORMAL HIGH (ref 70–99)
Glucose, Bld: 119 mg/dL — ABNORMAL HIGH (ref 70–99)
Glucose, Bld: 155 mg/dL — ABNORMAL HIGH (ref 70–99)
Glucose, Bld: 140 mg/dL — ABNORMAL HIGH (ref 70–99)
Glucose, Bld: 120 mg/dL — ABNORMAL HIGH (ref 70–99)
HCT: 37 % — ABNORMAL LOW (ref 39.0–52.0)
HCT: 25 % — ABNORMAL LOW (ref 39.0–52.0)
HCT: 32 % — ABNORMAL LOW (ref 39.0–52.0)
HCT: 26 % — ABNORMAL LOW (ref 39.0–52.0)
HCT: 27 % — ABNORMAL LOW (ref 39.0–52.0)
HCT: 28 % — ABNORMAL LOW (ref 39.0–52.0)
HEMOGLOBIN: 8.5 g/dL — AB (ref 13.0–17.0)
HEMOGLOBIN: 9.2 g/dL — AB (ref 13.0–17.0)
Hemoglobin: 12.6 g/dL — ABNORMAL LOW (ref 13.0–17.0)
Hemoglobin: 10.9 g/dL — ABNORMAL LOW (ref 13.0–17.0)
Hemoglobin: 8.8 g/dL — ABNORMAL LOW (ref 13.0–17.0)
Hemoglobin: 9.5 g/dL — ABNORMAL LOW (ref 13.0–17.0)
Potassium: 4.2 mmol/L (ref 3.5–5.1)
Potassium: 4.3 mmol/L (ref 3.5–5.1)
Potassium: 4.5 mmol/L (ref 3.5–5.1)
Potassium: 4.4 mmol/L (ref 3.5–5.1)
Potassium: 3.8 mmol/L (ref 3.5–5.1)
Potassium: 3.9 mmol/L (ref 3.5–5.1)
Sodium: 140 mmol/L (ref 135–145)
Sodium: 138 mmol/L (ref 135–145)
Sodium: 141 mmol/L (ref 135–145)
Sodium: 137 mmol/L (ref 135–145)
Sodium: 140 mmol/L (ref 135–145)
Sodium: 141 mmol/L (ref 135–145)

## 2018-04-15 LAB — BASIC METABOLIC PANEL
Anion gap: 7 (ref 5–15)
BUN: 16 mg/dL (ref 8–23)
CO2: 29 mmol/L (ref 22–32)
Calcium: 9.5 mg/dL (ref 8.9–10.3)
Chloride: 102 mmol/L (ref 98–111)
Creatinine, Ser: 0.88 mg/dL (ref 0.61–1.24)
GFR calc Af Amer: >60 mL/min (ref >60)
GFR calc non Af Amer: >60 mL/min (ref >60)
Glucose, Bld: 112 mg/dL — ABNORMAL HIGH (ref 70–99)
Potassium: 4.3 mmol/L (ref 3.5–5.1)
Sodium: 138 mmol/L (ref 135–145)

## 2018-04-15 LAB — APTT: aPTT: 24 seconds (ref 24–36)

## 2018-04-15 LAB — GLUCOSE, CAPILLARY
Glucose-Capillary: 116 mg/dL — ABNORMAL HIGH (ref 70–99)
Glucose-Capillary: 116 mg/dL — ABNORMAL HIGH (ref 70–99)
Glucose-Capillary: 120 mg/dL — ABNORMAL HIGH (ref 70–99)
Glucose-Capillary: 119 mg/dL — ABNORMAL HIGH (ref 70–99)

## 2018-04-15 LAB — SURGICAL PCR SCREEN
MRSA, PCR: NEGATIVE
Staphylococcus aureus: NEGATIVE

## 2018-04-15 LAB — ECHO INTRAOPERATIVE TEE
Height: 69 in
Weight: 3145.6 oz

## 2018-04-15 LAB — MAGNESIUM: Magnesium: 3.1 mg/dL — ABNORMAL HIGH (ref 1.7–2.4)

## 2018-04-15 LAB — PROTIME-INR
INR: 1.2 (ref 0.8–1.2)
Prothrombin Time: 15.1 seconds (ref 11.4–15.2)

## 2018-04-15 LAB — HEMOGLOBIN AND HEMATOCRIT, BLOOD
HCT: 30.5 % — ABNORMAL LOW (ref 39.0–52.0)
Hemoglobin: 10.6 g/dL — ABNORMAL LOW (ref 13.0–17.0)

## 2018-04-15 LAB — PLATELET COUNT: Platelets: 152 10*3/uL (ref 150–400)

## 2018-04-15 SURGERY — CORONARY ARTERY BYPASS GRAFTING (CABG)
Anesthesia: General | Site: Chest

## 2018-04-15 MED ORDER — ASPIRIN EC 325 MG PO TBEC
325.0000 mg | DELAYED_RELEASE_TABLET | Freq: Every day | ORAL | Status: DC
  Administered 2018-04-16 – 2018-04-20 (×5): 325 mg via ORAL
  Filled 2018-04-15 (×5): qty 1

## 2018-04-15 MED ORDER — ACETAMINOPHEN 160 MG/5ML PO SOLN: 1000.0000 mg | Freq: Four times a day (QID) | ORAL | Status: DC

## 2018-04-15 MED ORDER — LACTATED RINGERS IV SOLN: INTRAVENOUS | Status: DC

## 2018-04-15 MED ORDER — SODIUM CHLORIDE 0.45 % IV SOLN: INTRAVENOUS | Status: DC | PRN

## 2018-04-15 MED ORDER — DOCUSATE SODIUM 100 MG PO CAPS
200.0000 mg | ORAL_CAPSULE | Freq: Every day | ORAL | Status: DC
  Administered 2018-04-16 – 2018-04-20 (×4): 200 mg via ORAL
  Filled 2018-04-15 (×4): qty 2

## 2018-04-15 MED ORDER — ALBUMIN HUMAN 5 % IV SOLN
INTRAVENOUS | Status: DC | PRN
  Administered 2018-04-15: 12:00:00 via INTRAVENOUS

## 2018-04-15 MED ORDER — ACETAMINOPHEN 160 MG/5ML PO SOLN: 650.0000 mg | Freq: Once | ORAL | Status: AC

## 2018-04-15 MED ORDER — ACETAMINOPHEN 500 MG PO TABS
1000.0000 mg | ORAL_TABLET | Freq: Four times a day (QID) | ORAL | Status: DC
  Administered 2018-04-15 – 2018-04-20 (×16): 1000 mg via ORAL
  Filled 2018-04-15 (×16): qty 2

## 2018-04-15 MED ORDER — PANTOPRAZOLE SODIUM 40 MG PO TBEC
40.0000 mg | DELAYED_RELEASE_TABLET | Freq: Every day | ORAL | Status: DC
  Administered 2018-04-17 – 2018-04-20 (×4): 40 mg via ORAL
  Filled 2018-04-15 (×4): qty 1

## 2018-04-15 MED ORDER — PROPOFOL 10 MG/ML IV BOLUS
INTRAVENOUS | Status: AC
  Filled 2018-04-15: qty 20

## 2018-04-15 MED ORDER — LACTATED RINGERS IV SOLN
INTRAVENOUS | Status: DC
  Administered 2018-04-15: 23:00:00 via INTRAVENOUS

## 2018-04-15 MED ORDER — SODIUM CHLORIDE 0.9 % IV SOLN
INTRAVENOUS | Status: DC | PRN
  Administered 2018-04-15: 750 mg via INTRAVENOUS

## 2018-04-15 MED ORDER — SODIUM CHLORIDE (PF) 0.9 % IJ SOLN
OROMUCOSAL | Status: DC | PRN
  Administered 2018-04-15 (×3): 4 mL via TOPICAL

## 2018-04-15 MED ORDER — INSULIN REGULAR BOLUS VIA INFUSION
0.0000 [IU] | Freq: Three times a day (TID) | INTRAVENOUS | Status: DC
  Filled 2018-04-15: qty 10

## 2018-04-15 MED ORDER — METOPROLOL TARTRATE 12.5 MG HALF TABLET
12.5000 mg | ORAL_TABLET | Freq: Two times a day (BID) | ORAL | Status: DC
  Administered 2018-04-16 (×2): 12.5 mg via ORAL
  Filled 2018-04-15 (×2): qty 1

## 2018-04-15 MED ORDER — MAGNESIUM SULFATE 4 GM/100ML IV SOLN
4.0000 g | Freq: Once | INTRAVENOUS | Status: AC
  Administered 2018-04-15: 4 g via INTRAVENOUS
  Filled 2018-04-15: qty 100

## 2018-04-15 MED ORDER — LACTATED RINGERS IV SOLN
INTRAVENOUS | Status: DC | PRN
  Administered 2018-04-15: 07:00:00 via INTRAVENOUS

## 2018-04-15 MED ORDER — SODIUM CHLORIDE 0.9 % IV SOLN
INTRAVENOUS | Status: DC | PRN
  Administered 2018-04-15: 25 ug/min via INTRAVENOUS

## 2018-04-15 MED ORDER — ASPIRIN 81 MG PO CHEW: 324.0000 mg | CHEWABLE_TABLET | Freq: Every day | ORAL | Status: DC

## 2018-04-15 MED ORDER — LACTATED RINGERS IV SOLN: 500.0000 mL | Freq: Once | INTRAVENOUS | Status: DC | PRN

## 2018-04-15 MED ORDER — SODIUM CHLORIDE 0.9 % IV SOLN: 250.0000 mL | INTRAVENOUS | Status: DC

## 2018-04-15 MED ORDER — HEPARIN SODIUM (PORCINE) 1000 UNIT/ML IJ SOLN
INTRAMUSCULAR | Status: AC
  Filled 2018-04-15: qty 1

## 2018-04-15 MED ORDER — FAMOTIDINE IN NACL 20-0.9 MG/50ML-% IV SOLN: 20.0000 mg | Freq: Two times a day (BID) | INTRAVENOUS | Status: DC

## 2018-04-15 MED ORDER — SODIUM CHLORIDE 0.9 % IV SOLN
1.5000 g | Freq: Two times a day (BID) | INTRAVENOUS | Status: AC
  Administered 2018-04-15 – 2018-04-17 (×4): 1.5 g via INTRAVENOUS
  Filled 2018-04-15 (×4): qty 1.5

## 2018-04-15 MED ORDER — SODIUM CHLORIDE 0.9% FLUSH: 3.0000 mL | INTRAVENOUS | Status: DC | PRN

## 2018-04-15 MED ORDER — HEPARIN SODIUM (PORCINE) 1000 UNIT/ML IJ SOLN
INTRAMUSCULAR | Status: DC | PRN
  Administered 2018-04-15: 29000 [IU] via INTRAVENOUS
  Administered 2018-04-15: 2000 [IU] via INTRAVENOUS

## 2018-04-15 MED ORDER — EPHEDRINE 5 MG/ML INJ
INTRAVENOUS | Status: AC
  Filled 2018-04-15: qty 10

## 2018-04-15 MED ORDER — PROPOFOL 10 MG/ML IV BOLUS
INTRAVENOUS | Status: DC | PRN
  Administered 2018-04-15: 50 mg via INTRAVENOUS

## 2018-04-15 MED ORDER — VANCOMYCIN HCL IN DEXTROSE 1-5 GM/200ML-% IV SOLN
1000.0000 mg | Freq: Once | INTRAVENOUS | Status: AC
  Administered 2018-04-15: 1000 mg via INTRAVENOUS
  Filled 2018-04-15: qty 200

## 2018-04-15 MED ORDER — MORPHINE SULFATE (PF) 2 MG/ML IV SOLN
1.0000 mg | INTRAVENOUS | Status: DC | PRN
  Administered 2018-04-15: 2 mg via INTRAVENOUS
  Administered 2018-04-16 (×2): 4 mg via INTRAVENOUS
  Administered 2018-04-16 (×2): 2 mg via INTRAVENOUS
  Filled 2018-04-15: qty 1
  Filled 2018-04-15 (×2): qty 2
  Filled 2018-04-15 (×2): qty 1

## 2018-04-15 MED ORDER — MIDAZOLAM HCL (PF) 10 MG/2ML IJ SOLN
INTRAMUSCULAR | Status: AC
  Filled 2018-04-15: qty 2

## 2018-04-15 MED ORDER — PHENYLEPHRINE 40 MCG/ML (10ML) SYRINGE FOR IV PUSH (FOR BLOOD PRESSURE SUPPORT)
PREFILLED_SYRINGE | INTRAVENOUS | Status: AC
  Filled 2018-04-15: qty 10

## 2018-04-15 MED ORDER — PROTAMINE SULFATE 10 MG/ML IV SOLN
INTRAVENOUS | Status: DC | PRN
  Administered 2018-04-15: 100 mg via INTRAVENOUS
  Administered 2018-04-15: 50 mg via INTRAVENOUS
  Administered 2018-04-15: 100 mg via INTRAVENOUS
  Administered 2018-04-15: 50 mg via INTRAVENOUS
  Administered 2018-04-15: 10 mg via INTRAVENOUS

## 2018-04-15 MED ORDER — FENTANYL CITRATE (PF) 250 MCG/5ML IJ SOLN
INTRAMUSCULAR | Status: DC | PRN
  Administered 2018-04-15: 650 ug via INTRAVENOUS
  Administered 2018-04-15: 100 ug via INTRAVENOUS
  Administered 2018-04-15: 150 ug via INTRAVENOUS
  Administered 2018-04-15: 100 ug via INTRAVENOUS
  Administered 2018-04-15: 250 ug via INTRAVENOUS

## 2018-04-15 MED ORDER — ACETAMINOPHEN 650 MG RE SUPP
650.0000 mg | Freq: Once | RECTAL | Status: AC
  Administered 2018-04-15: 650 mg via RECTAL

## 2018-04-15 MED ORDER — BISACODYL 5 MG PO TBEC
10.0000 mg | DELAYED_RELEASE_TABLET | Freq: Every day | ORAL | Status: DC
  Administered 2018-04-16 – 2018-04-20 (×4): 10 mg via ORAL
  Filled 2018-04-15 (×5): qty 2

## 2018-04-15 MED ORDER — MIDAZOLAM HCL 5 MG/5ML IJ SOLN
INTRAMUSCULAR | Status: DC | PRN
  Administered 2018-04-15 (×2): 2 mg via INTRAVENOUS
  Administered 2018-04-15: 1 mg via INTRAVENOUS
  Administered 2018-04-15: 3 mg via INTRAVENOUS
  Administered 2018-04-15: 2 mg via INTRAVENOUS

## 2018-04-15 MED ORDER — ROCURONIUM BROMIDE 10 MG/ML (PF) SYRINGE
PREFILLED_SYRINGE | INTRAVENOUS | Status: DC | PRN
  Administered 2018-04-15 (×3): 50 mg via INTRAVENOUS
  Administered 2018-04-15: 100 mg via INTRAVENOUS

## 2018-04-15 MED ORDER — INSULIN REGULAR(HUMAN) IN NACL 100-0.9 UT/100ML-% IV SOLN: INTRAVENOUS | Status: DC

## 2018-04-15 MED ORDER — OXYCODONE HCL 5 MG PO TABS
5.0000 mg | ORAL_TABLET | ORAL | Status: DC | PRN
  Administered 2018-04-16: 10 mg via ORAL
  Administered 2018-04-16: 5 mg via ORAL
  Administered 2018-04-16 – 2018-04-20 (×17): 10 mg via ORAL
  Filled 2018-04-15 (×7): qty 2
  Filled 2018-04-15: qty 1
  Filled 2018-04-15 (×11): qty 2

## 2018-04-15 MED ORDER — CHLORHEXIDINE GLUCONATE 0.12 % MT SOLN
15.0000 mL | OROMUCOSAL | Status: AC
  Administered 2018-04-15: 15 mL via OROMUCOSAL

## 2018-04-15 MED ORDER — NITROGLYCERIN IN D5W 200-5 MCG/ML-% IV SOLN: 0.0000 ug/min | INTRAVENOUS | Status: DC

## 2018-04-15 MED ORDER — FENTANYL CITRATE (PF) 250 MCG/5ML IJ SOLN
INTRAMUSCULAR | Status: AC
  Filled 2018-04-15: qty 25

## 2018-04-15 MED ORDER — HEMOSTATIC AGENTS (NO CHARGE) OPTIME
TOPICAL | Status: DC | PRN
  Administered 2018-04-15: 1 via TOPICAL

## 2018-04-15 MED ORDER — SODIUM CHLORIDE 0.9 % IV SOLN
INTRAVENOUS | Status: DC | PRN
  Administered 2018-04-15: 20 ug/min via INTRAVENOUS

## 2018-04-15 MED ORDER — PHENYLEPHRINE HCL-NACL 20-0.9 MG/250ML-% IV SOLN
0.0000 ug/min | INTRAVENOUS | Status: DC
  Administered 2018-04-15: 44 ug/min via INTRAVENOUS
  Filled 2018-04-15: qty 250

## 2018-04-15 MED ORDER — 0.9 % SODIUM CHLORIDE (POUR BTL) OPTIME
TOPICAL | Status: DC | PRN
  Administered 2018-04-15 (×5): 1000 mL

## 2018-04-15 MED ORDER — METOPROLOL TARTRATE 25 MG/10 ML ORAL SUSPENSION: 12.5000 mg | Freq: Two times a day (BID) | ORAL | Status: DC

## 2018-04-15 MED ORDER — METOPROLOL TARTRATE 5 MG/5ML IV SOLN: 2.5000 mg | INTRAVENOUS | Status: DC | PRN

## 2018-04-15 MED ORDER — ALBUMIN HUMAN 5 % IV SOLN
250.0000 mL | INTRAVENOUS | Status: AC | PRN
  Administered 2018-04-15 – 2018-04-16 (×4): 12.5 g via INTRAVENOUS
  Filled 2018-04-15: qty 250

## 2018-04-15 MED ORDER — POTASSIUM CHLORIDE 10 MEQ/50ML IV SOLN
10.0000 meq | INTRAVENOUS | Status: AC
  Administered 2018-04-15 (×3): 10 meq via INTRAVENOUS

## 2018-04-15 MED ORDER — LACTATED RINGERS IV SOLN
INTRAVENOUS | Status: DC | PRN
  Administered 2018-04-15 (×2): via INTRAVENOUS

## 2018-04-15 MED ORDER — NOREPINEPHRINE 4 MG/250ML-% IV SOLN
0.0000 ug/min | INTRAVENOUS | Status: DC
  Filled 2018-04-15 (×2): qty 250

## 2018-04-15 MED ORDER — PROTAMINE SULFATE 10 MG/ML IV SOLN
INTRAVENOUS | Status: AC
  Filled 2018-04-15: qty 25

## 2018-04-15 MED ORDER — BISACODYL 10 MG RE SUPP: 10.0000 mg | Freq: Every day | RECTAL | Status: DC

## 2018-04-15 MED ORDER — SODIUM CHLORIDE 0.9 % IV SOLN
INTRAVENOUS | Status: DC
  Administered 2018-04-15: 13:00:00 via INTRAVENOUS

## 2018-04-15 MED ORDER — DEXMEDETOMIDINE HCL IN NACL 200 MCG/50ML IV SOLN
0.0000 ug/kg/h | INTRAVENOUS | Status: DC
  Administered 2018-04-15: 0.5 ug/kg/h via INTRAVENOUS
  Filled 2018-04-15: qty 50

## 2018-04-15 MED ORDER — ONDANSETRON HCL 4 MG/2ML IJ SOLN
4.0000 mg | Freq: Four times a day (QID) | INTRAMUSCULAR | Status: DC | PRN
  Administered 2018-04-16: 4 mg via INTRAVENOUS
  Filled 2018-04-15: qty 2

## 2018-04-15 MED ORDER — SODIUM CHLORIDE 0.9% FLUSH
3.0000 mL | Freq: Two times a day (BID) | INTRAVENOUS | Status: DC
  Administered 2018-04-16: 6 mL via INTRAVENOUS
  Administered 2018-04-16 – 2018-04-20 (×5): 3 mL via INTRAVENOUS

## 2018-04-15 MED ORDER — MIDAZOLAM HCL 2 MG/2ML IJ SOLN: 2.0000 mg | INTRAMUSCULAR | Status: DC | PRN

## 2018-04-15 MED ORDER — TRAMADOL HCL 50 MG PO TABS
50.0000 mg | ORAL_TABLET | ORAL | Status: DC | PRN
  Administered 2018-04-16 – 2018-04-19 (×5): 100 mg via ORAL
  Filled 2018-04-15 (×2): qty 2
  Filled 2018-04-15: qty 1
  Filled 2018-04-15 (×3): qty 2

## 2018-04-15 SURGICAL SUPPLY — 90 items
ADAPTER CARDIO PERF ANTE/RETRO (ADAPTER) ×4 IMPLANT
BAG DECANTER FOR FLEXI CONT (MISCELLANEOUS) ×4 IMPLANT
BANDAGE ACE 4X5 VEL STRL LF (GAUZE/BANDAGES/DRESSINGS) ×4 IMPLANT
BANDAGE ACE 6X5 VEL STRL LF (GAUZE/BANDAGES/DRESSINGS) ×4 IMPLANT
BANDAGE ELASTIC 4 VELCRO ST LF (GAUZE/BANDAGES/DRESSINGS) ×4 IMPLANT
BANDAGE ELASTIC 6 VELCRO ST LF (GAUZE/BANDAGES/DRESSINGS) ×4 IMPLANT
BASKET HEART  (ORDER IN 25'S) (MISCELLANEOUS) ×1
BASKET HEART (ORDER IN 25'S) (MISCELLANEOUS) ×1
BASKET HEART (ORDER IN 25S) (MISCELLANEOUS) ×2 IMPLANT
BLADE STERNUM SYSTEM 6 (BLADE) ×4 IMPLANT
BLADE SURG 11 STRL SS (BLADE) ×4 IMPLANT
BLADE SURG 12 STRL SS (BLADE) ×4 IMPLANT
BNDG GAUZE ELAST 4 BULKY (GAUZE/BANDAGES/DRESSINGS) ×4 IMPLANT
CANISTER SUCT 3000ML PPV (MISCELLANEOUS) ×4 IMPLANT
CANNULA GUNDRY RCSP 15FR (MISCELLANEOUS) ×4 IMPLANT
CATH CPB KIT VANTRIGT (MISCELLANEOUS) ×4 IMPLANT
CATH ROBINSON RED A/P 18FR (CATHETERS) ×12 IMPLANT
CATH THORACIC 36FR RT ANG (CATHETERS) ×4 IMPLANT
CLIP VESOCCLUDE SM WIDE 24/CT (CLIP) ×4 IMPLANT
COVER WAND RF STERILE (DRAPES) ×4 IMPLANT
CRADLE DONUT ADULT HEAD (MISCELLANEOUS) ×4 IMPLANT
DERMABOND ADVANCED (GAUZE/BANDAGES/DRESSINGS) ×2
DERMABOND ADVANCED .7 DNX12 (GAUZE/BANDAGES/DRESSINGS) ×2 IMPLANT
DRAIN CHANNEL 32F RND 10.7 FF (WOUND CARE) ×4 IMPLANT
DRAPE CARDIOVASCULAR INCISE (DRAPES) ×2
DRAPE SLUSH/WARMER DISC (DRAPES) ×4 IMPLANT
DRAPE SRG 135X102X78XABS (DRAPES) ×2 IMPLANT
DRSG AQUACEL AG ADV 3.5X14 (GAUZE/BANDAGES/DRESSINGS) ×4 IMPLANT
ELECT BLADE 4.0 EZ CLEAN MEGAD (MISCELLANEOUS) ×4
ELECT BLADE 6.5 EXT (BLADE) ×4 IMPLANT
ELECT CAUTERY BLADE 6.4 (BLADE) ×4 IMPLANT
ELECT REM PT RETURN 9FT ADLT (ELECTROSURGICAL) ×8
ELECTRODE BLDE 4.0 EZ CLN MEGD (MISCELLANEOUS) ×2 IMPLANT
ELECTRODE REM PT RTRN 9FT ADLT (ELECTROSURGICAL) ×4 IMPLANT
FELT TEFLON 1X6 (MISCELLANEOUS) ×8 IMPLANT
GAUZE SPONGE 4X4 12PLY STRL (GAUZE/BANDAGES/DRESSINGS) ×8 IMPLANT
GAUZE SPONGE 4X4 12PLY STRL LF (GAUZE/BANDAGES/DRESSINGS) ×8 IMPLANT
GLOVE BIO SURGEON STRL SZ7.5 (GLOVE) ×12 IMPLANT
GOWN STRL REUS W/ TWL LRG LVL3 (GOWN DISPOSABLE) ×8 IMPLANT
GOWN STRL REUS W/TWL LRG LVL3 (GOWN DISPOSABLE) ×8
HEMOSTAT POWDER SURGIFOAM 1G (HEMOSTASIS) ×12 IMPLANT
HEMOSTAT SURGICEL 2X14 (HEMOSTASIS) ×4 IMPLANT
INSERT FOGARTY XLG (MISCELLANEOUS) IMPLANT
KIT BASIN OR (CUSTOM PROCEDURE TRAY) ×4 IMPLANT
KIT SUCTION CATH 14FR (SUCTIONS) ×4 IMPLANT
KIT TURNOVER KIT B (KITS) ×4 IMPLANT
KIT VASOVIEW HEMOPRO 2 VH 4000 (KITS) ×4 IMPLANT
LEAD PACING MYOCARDI (MISCELLANEOUS) ×4 IMPLANT
MARKER GRAFT CORONARY BYPASS (MISCELLANEOUS) ×12 IMPLANT
NS IRRIG 1000ML POUR BTL (IV SOLUTION) ×20 IMPLANT
PACK E OPEN HEART (SUTURE) ×4 IMPLANT
PACK OPEN HEART (CUSTOM PROCEDURE TRAY) ×4 IMPLANT
PAD ARMBOARD 7.5X6 YLW CONV (MISCELLANEOUS) ×8 IMPLANT
PAD ELECT DEFIB RADIOL ZOLL (MISCELLANEOUS) ×4 IMPLANT
PENCIL BUTTON HOLSTER BLD 10FT (ELECTRODE) ×4 IMPLANT
PUNCH AORTIC ROTATE  4.5MM 8IN (MISCELLANEOUS) ×4 IMPLANT
PUNCH AORTIC ROTATE 4.0MM (MISCELLANEOUS) IMPLANT
PUNCH AORTIC ROTATE 4.5MM 8IN (MISCELLANEOUS) IMPLANT
SET CARDIOPLEGIA MPS 5001102 (MISCELLANEOUS) ×4 IMPLANT
SOLUTION ANTI FOG 6CC (MISCELLANEOUS) ×4 IMPLANT
SURGIFLO W/THROMBIN 8M KIT (HEMOSTASIS) ×4 IMPLANT
SUT BONE WAX W31G (SUTURE) ×4 IMPLANT
SUT MNCRL AB 4-0 PS2 18 (SUTURE) ×4 IMPLANT
SUT PROLENE 3 0 SH DA (SUTURE) IMPLANT
SUT PROLENE 4 0 RB 1 (SUTURE) ×2
SUT PROLENE 4 0 SH DA (SUTURE) ×4 IMPLANT
SUT PROLENE 4-0 RB1 .5 CRCL 36 (SUTURE) ×2 IMPLANT
SUT PROLENE 6 0 C 1 30 (SUTURE) ×16 IMPLANT
SUT PROLENE 6 0 CC (SUTURE) ×12 IMPLANT
SUT PROLENE 8 0 BV175 6 (SUTURE) IMPLANT
SUT PROLENE BLUE 7 0 (SUTURE) ×4 IMPLANT
SUT SILK  1 MH (SUTURE)
SUT SILK 1 MH (SUTURE) IMPLANT
SUT SILK 2 0 SH CR/8 (SUTURE) ×4 IMPLANT
SUT SILK 3 0 SH CR/8 (SUTURE) IMPLANT
SUT STEEL 6MS V (SUTURE) ×4 IMPLANT
SUT STEEL SZ 6 DBL 3X14 BALL (SUTURE) ×4 IMPLANT
SUT VIC AB 1 CTX 36 (SUTURE) ×4
SUT VIC AB 1 CTX36XBRD ANBCTR (SUTURE) ×4 IMPLANT
SUT VIC AB 2-0 CT1 27 (SUTURE) ×2
SUT VIC AB 2-0 CT1 TAPERPNT 27 (SUTURE) ×2 IMPLANT
SYSTEM SAHARA CHEST DRAIN ATS (WOUND CARE) ×4 IMPLANT
TAPE CLOTH SURG 4X10 WHT LF (GAUZE/BANDAGES/DRESSINGS) ×8 IMPLANT
TAPE PAPER 2X10 WHT MICROPORE (GAUZE/BANDAGES/DRESSINGS) ×4 IMPLANT
TOWEL GREEN STERILE (TOWEL DISPOSABLE) ×4 IMPLANT
TOWEL GREEN STERILE FF (TOWEL DISPOSABLE) ×4 IMPLANT
TRAY FOLEY SLVR 16FR TEMP STAT (SET/KITS/TRAYS/PACK) ×4 IMPLANT
TUBING LAP HI FLOW INSUFFLATIO (TUBING) ×4 IMPLANT
UNDERPAD 30X30 (UNDERPADS AND DIAPERS) ×4 IMPLANT
WATER STERILE IRR 1000ML POUR (IV SOLUTION) ×8 IMPLANT

## 2018-04-15 NOTE — Progress Notes (Signed)
Pre Procedure note for inpatients:   Eddie Nelson has been scheduled for Procedure(s): CORONARY ARTERY BYPASS GRAFTING (CABG) (N/A) TRANSESOPHAGEAL ECHOCARDIOGRAM (TEE) (N/A) today. The various methods of treatment have been discussed with the patient. After consideration of the risks, benefits and treatment options the patient has consented to the planned procedure.   The patient has been seen and labs reviewed. There are no changes in the patient's condition to prevent proceeding with the planned procedure today.  Recent labs:  Lab Results  Component Value Date   WBC 6.8 04/15/2018   HGB 14.9 04/15/2018   HCT 43.6 04/15/2018   PLT 187 04/15/2018   GLUCOSE 112 (H) 04/15/2018   CHOL 301 (H) 04/10/2018   TRIG 114 04/10/2018   HDL 48 04/10/2018   LDLCALC 230 (H) 04/10/2018   ALT 27 04/13/2018   AST 16 04/13/2018   NA 138 04/15/2018   K 4.3 04/15/2018   CL 102 04/15/2018   CREATININE 0.88 04/15/2018   BUN 16 04/15/2018   CO2 29 04/15/2018   TSH 3.422 04/13/2018   INR 0.9 04/13/2018   HGBA1C 6.3 (H) 04/13/2018    Kerin Perna III, MD 04/15/2018 7:02 AM     \

## 2018-04-15 NOTE — Anesthesia Postprocedure Evaluation (Signed)
Anesthesia Post Note  Patient: Alistair Soliz  Procedure(s) Performed: CORONARY ARTERY BYPASS GRAFTING (CABG), ON PUMP, TIMES THREE, USING LEFT INTERNAL MAMMARY ARTERY  AND ENDOSCOPICALLY HARVESTED LEFT GREATER SAPHENOUS VEIN (N/A Chest) TRANSESOPHAGEAL ECHOCARDIOGRAM (TEE) (N/A )     Patient location during evaluation: SICU Anesthesia Type: General Level of consciousness: sedated Pain management: pain level controlled Vital Signs Assessment: post-procedure vital signs reviewed and stable Respiratory status: patient remains intubated per anesthesia plan Cardiovascular status: stable Postop Assessment: no apparent nausea or vomiting Anesthetic complications: no    Last Vitals:  Vitals:   04/15/18 1530 04/15/18 1545  BP:    Pulse: 89 89  Resp: 12 12  Temp: 36.8 C 36.8 C  SpO2: 100% 100%    Last Pain:  Vitals:   04/15/18 1400  TempSrc: Core (Comment)  PainSc:                  Ameli Sangiovanni,W. EDMOND

## 2018-04-15 NOTE — Anesthesia Procedure Notes (Signed)
Procedure Name: Intubation Date/Time: 04/15/2018 7:47 AM Performed by: Shirlyn Goltz, CRNA Pre-anesthesia Checklist: Patient identified, Emergency Drugs available, Suction available and Patient being monitored Patient Re-evaluated:Patient Re-evaluated prior to induction Oxygen Delivery Method: Circle system utilized Preoxygenation: Pre-oxygenation with 100% oxygen Induction Type: IV induction Ventilation: Mask ventilation without difficulty and Oral airway inserted - appropriate to patient size Laryngoscope Size: Mac and 4 Grade View: Grade III Tube type: Oral Tube size: 8.0 mm Number of attempts: 1 Airway Equipment and Method: Stylet Placement Confirmation: ETT inserted through vocal cords under direct vision,  positive ETCO2 and breath sounds checked- equal and bilateral Secured at: 23 cm Tube secured with: Tape Dental Injury: Teeth and Oropharynx as per pre-operative assessment

## 2018-04-15 NOTE — Progress Notes (Signed)
Patient sent for CABG pre-op. Report called.

## 2018-04-15 NOTE — Transfer of Care (Signed)
Immediate Anesthesia Transfer of Care Note  Patient: Eddie Nelson  Procedure(s) Performed: CORONARY ARTERY BYPASS GRAFTING (CABG), ON PUMP, TIMES THREE, USING LEFT INTERNAL MAMMARY ARTERY  AND ENDOSCOPICALLY HARVESTED LEFT GREATER SAPHENOUS VEIN (N/A Chest) TRANSESOPHAGEAL ECHOCARDIOGRAM (TEE) (N/A )  Patient Location: ICU  Anesthesia Type:General  Level of Consciousness: sedated and Patient remains intubated per anesthesia plan  Airway & Oxygen Therapy: Patient remains intubated per anesthesia plan and Patient placed on Ventilator (see vital sign flow sheet for setting)  Post-op Assessment: Report given to RN and Post -op Vital signs reviewed and stable ABP 95/51; Apaced 80.   Post vital signs: Reviewed and stable  Last Vitals:  Vitals Value Taken Time  BP    Temp    Pulse 80 04/15/2018 12:52 PM  Resp 12 04/15/2018 12:52 PM  SpO2 97 % 04/15/2018 12:52 PM  Vitals shown include unvalidated device data.  Last Pain:  Vitals:   04/15/18 0441  TempSrc: Oral  PainSc:       Patients Stated Pain Goal: 0 (04/14/18 6440)  Complications: No apparent anesthesia complications

## 2018-04-15 NOTE — Procedures (Signed)
Extubation Procedure Note  Patient Details:   Name: Eddie Nelson DOB: 11-09-1955 MRN: [id number]   Airway Documentation:    Vent end date: (not recorded) Vent end time: (not recorded)   Evaluation  O2 sats: stable throughout Complications: No apparent complications Patient did tolerate procedure well. Bilateral Breath Sounds: Clear, Diminished   Yes   Positive cuff leak noted.  NIF - 24, VC 1.16L  Pt placed on [place] 4L with humidity, no stridor noted. Pt able to reach 400 mL using incentive spirometer.  Rayburn Felt 04/15/2018, 5:43 PM

## 2018-04-15 NOTE — Brief Op Note (Signed)
04/11/2018 - 04/15/2018  11:08 AM  PATIENT:  Eddie Nelson  63 y.o. male  PRE-OPERATIVE DIAGNOSIS:  CAD, positive Cardiolyte stress test  POST-OPERATIVE DIAGNOSIS:  CAD, positive Cardiolyte stress test  PROCEDURE:  Procedure(s): CORONARY ARTERY BYPASS GRAFTING   LIMA->LAD SVG->OM SVG->PDA  TRANSESOPHAGEAL ECHOCARDIOGRAM   SURGEON:  Surgeon(s) and Role:    Kerin Perna, MD - Primary  PHYSICIAN ASSISTANT: Hedwig Morton, PA-C  ANESTHESIA:   general  EBL:  Per anesthesia and perfusion records  BLOOD ADMINISTERED:none  DRAINS: Left pleural and mediastinal tubes   LOCAL MEDICATIONS USED:  NONE  SPECIMEN:  No Specimen  DISPOSITION OF SPECIMEN:  N/A  COUNTS:  YES  DICTATION: .Dragon Dictation  PLAN OF CARE: Admit to inpatient   PATIENT DISPOSITION:  ICU - intubated and hemodynamically stable.   Delay start of Pharmacological VTE agent (>24hrs) due to surgical blood loss or risk of bleeding: yes

## 2018-04-15 NOTE — Anesthesia Procedure Notes (Signed)
Central Venous Catheter Insertion Performed by: Annye Asa, MD, anesthesiologist Start/End3/03/2018 6:29 AM, 04/15/2018 6:42 AM Patient location: Pre-op. Preanesthetic checklist: patient identified, IV checked, risks and benefits discussed, surgical consent, monitors and equipment checked, pre-op evaluation, timeout performed and anesthesia consent Position: Trendelenburg Lidocaine 1% used for infiltration and patient sedated Hand hygiene performed , maximum sterile barriers used  and Seldinger technique used Catheter size: 8.5 Fr PA cath was placed.Sheath introducer Swan type:thermodilution Procedure performed using ultrasound guided technique. Ultrasound Notes:anatomy identified, needle tip was noted to be adjacent to the nerve/plexus identified, no ultrasound evidence of intravascular and/or intraneural injection and image(s) printed for medical record Attempts: 1 Following insertion, line sutured, dressing applied and Biopatch. Post procedure assessment: blood return through all ports, free fluid flow and no air  Patient tolerated the procedure well with no immediate complications. Additional procedure comments: PA catheter:  Routine monitors. Timeout, sterile prep, drape, FBP R neck.  Trendelenburg position.  1% Lido local, finder and trocar RIJ 1st pass with US guidance.  Cordis placed over J wire. PA catheter in easily.  Sterile dressing applied.  Patient tolerated well, VSS.  Jenita Seashore, MD.

## 2018-04-15 NOTE — Op Note (Signed)
NAME: Eddie Nelson, [facility] MEDICAL RECORD TD:[id number] ACCOUNT [id number] DATE OF BIRTH:1956/01/10 FACILITY: [facility] LOCATION: [facility] PHYSICIAN:PETER VAN TRIGT III, MD  OPERATIVE REPORT  DATE OF PROCEDURE:  04/15/2018  OPERATION: 1.  Coronary artery bypass grafting x3 (left internal mammary artery to left anterior descending, saphenous vein graft to OM1, saphenous vein graft to posterior descending). 2.  Endoscopic harvest of left leg greater saphenous vein.  SURGEON:  Kerin Perna, MD  ASSISTANT:  Jillyn Hidden, PA-C.  ANESTHESIA:  General by Dr. Marguerita Merles.  PREOPERATIVE DIAGNOSIS:  Severe 3-vessel coronary artery disease, positive stress test.  POSTOPERATIVE DIAGNOSIS:  Severe 3-vessel coronary artery disease, positive stress test.  CLINICAL NOTE:  The patient is a 63 year old hypertensive nonsmoker with dyslipidemia and positive family history for CABG.  He had a stress test prior to orthopedic back surgery, which was strongly positive.  Subsequent cardiac catheterization  demonstrated severe 3-vessel coronary disease including total occlusion of the RCA, total occlusion of the LAD and 95% stenosis of the OM1.  LV systolic function was preserved.  Surgical coronary revascularization was recommended.  After reviewing his  angiograms and echocardiogram, I discussed the procedure of CABG for treatment of his CAD.  I discussed the major aspects of the procedure including the use of general anesthesia and cardiopulmonary bypass, the location of the surgical incisions, and the  expected postoperative hospital recovery.  I discussed with the patient his expected recovery as well as the risks to him of CABG including risk of stroke, bleeding, blood transfusion, postoperative infection, postoperative pulmonary problems including  pleural effusion, postoperative organ failure, and death.  After reviewing these issues, he demonstrated his understanding and agreed to proceed with  surgery under what I felt was an informed consent.  OPERATIVE FINDINGS: 1.  Severe 3-vessel coronary disease with adequate targets and adequate conduit. 2.  No blood transfusions required. 3.  No inotropes required. 4.  Preservation of LV function after separation from cardiopulmonary bypass by TEE.  DESCRIPTION OF PROCEDURE:  The patient was brought from preop holding where informed consent was documented and the final preoperative issues were addressed.  He was placed supine on the operating table and general anesthesia was induced under invasive  hemodynamic monitoring.  A transesophageal echo probe was placed by the anesthesia team.  The patient was prepped and draped as a sterile field.  A proper time-out was performed.  A sternal incision was made as the saphenous vein was harvested  endoscopically from the left leg.  The left internal mammary artery was harvested as a pedicle graft from its origin at the subclavian vessels.  It was a 1.5 mm vessel with good flow.  The sternal retractor was placed, and the pericardium was opened and  suspended.  The aorta was inspected and palpated and had no significant calcific disease.  Pursestrings were placed in the ascending aorta and right atrium.  Heparin was administered.  The ACT was therapeutic.  The patient was cannulated and placed on  cardiopulmonary bypass.  The coronaries were identified for grafting.  The distal PDA was patent and an adequate target.  The OM1 was an adequate target and the LAD was patent distally and an adequate target.  The vein and mammary artery were prepared  for the distal anastomoses and cardioplegia cannulas were placed both antegrade and retrograde cold blood cardioplegia.  The patient was cooled to 32 degrees, and the aortic crossclamp was applied.  A liter of cold blood cardioplegia was delivered in  split doses  between the antegrade aortic and retrograde coronary sinus catheters.  There was good cardioplegic arrest and  supple temperature dropped less than 12 degrees.  Cardioplegia was delivered every 20 minutes.  The distal coronary anastomoses were performed.  The first distal anastomosis was the posterior descending.  This was occluded proximally.  A reverse saphenous vein was sewn end-to-side with running 7-0 Prolene with good flow through the graft.   Cardioplegia was redosed.  The second distal anastomosis was the OM branch of the circumflex.  This had a proximal 95% stenosis.  Reverse saphenous vein was sewn end-to-side with running 7-0 Prolene with good flow through the graft.  Cardioplegia was redosed.  The third distal anastomosis was the distal third LAD.  It was occluded proximally through the left IMA pedicle.  It was brought through an opening in the left lateral pericardium and was brought down onto the LAD and sewn end-to-side with running 8-0  Prolene.  There was good flow through the anastomosis after briefly releasing the pedicle bulldog and the mammary artery.  The bulldog was reapplied and the pedicle was secured to the epicardium.  Cardioplegia was redosed.  While the cross clamp was still in place, 2 proximal vein anastomoses were performed on the ascending aorta using a 4.5 mm punch and running 6-0 Prolene.  Prior to tying down the final proximal anastomosis, air was vented from the coronaries with a dose  of retrograde warm blood cardioplegia.  The crossclamp was then removed.  The heart was cardioverted back to a regular rhythm.  The vein grafts were deaired and opened.  Each had good flow.  Hemostasis was documented at the proximal and distal sites.  The cardioplegia cannulas were removed.  The patient was rewarmed and  reperfused.  Temporary pacing wires were applied.  The lungs reexpanded.  Ventilator was resumed.  The patient was then weaned from cardiopulmonary bypass without needing inotropes.  Hemodynamics were stable.  An echo showed preserved LV systolic function.  Protamine was  administered without adverse reaction.  The cannulas were removed.  The  mediastinum was irrigated.  The superior pericardial fat was closed over the aorta.  The anterior mediastinum and left pleural chest tubes were placed and brought out through separate incisions.  The sternum was closed with wire.  The pectoralis fascia  was closed with a running #1 Vicryl.  Subcutaneous and skin layers were closed using running Vicryl and sterile dressings were applied.  Total cardiopulmonary bypass time was 95 minutes.  TN/NUANCE  D:04/15/2018 T:04/15/2018 JOB:[id number]

## 2018-04-15 NOTE — Anesthesia Procedure Notes (Signed)
Arterial Line Insertion Start/End3/03/2018 6:55 AM, 04/15/2018 7:05 AM Performed by: Adonis Housekeeper, CRNA, CRNA  Patient location: Pre-op. Preanesthetic checklist: patient identified, IV checked, site marked, risks and benefits discussed, surgical consent, monitors and equipment checked and pre-op evaluation Patient sedated Left, radial was placed Catheter size: 20 G Hand hygiene performed , maximum sterile barriers used  and Seldinger technique used Allen's test indicative of satisfactory collateral circulation Attempts: 1 Procedure performed without using ultrasound guided technique. Following insertion, dressing applied and Biopatch. Post procedure assessment: normal

## 2018-04-15 NOTE — Progress Notes (Signed)
I responded to a [place] to provide Advance Directive information for the patient. I visited the patient's room, however, the patient was unable to communicate with me. I spoke with the nurse concerning the AD and she noted that the patient's brother will be returning on tomorrow. I left a copy of the AD in the patient's room for later review and completion.    04/15/18 1600  Clinical Encounter Type  Visited With Patient not available;Health care provider  Visit Type Spiritual support  Referral From Nurse  Consult/Referral To Chaplain  Spiritual Encounters  Spiritual Needs Prayer;Literature    Chaplain Dr Melvyn Novas

## 2018-04-15 NOTE — Progress Notes (Signed)
[facility] BRIEF SICU PROGRESS NOTE  Day of Surgery  S/P Procedure(s) (LRB): CORONARY ARTERY BYPASS GRAFTING (CABG), ON PUMP, TIMES THREE, USING LEFT INTERNAL MAMMARY ARTERY  AND ENDOSCOPICALLY HARVESTED LEFT GREATER SAPHENOUS VEIN (N/A) TRANSESOPHAGEAL ECHOCARDIOGRAM (TEE) (N/A)   Extubated uneventfully AAI paced w/ stable hemodynamics Breathing comfortably on 4 L/min via [place] Chest tube output low UOP > 100 mLhr Labs okay  Plan: Continue routine early postop  Purcell Nails, MD 04/15/2018 6:39 PM

## 2018-04-16 ENCOUNTER — Encounter (HOSPITAL_COMMUNITY): Payer: Self-pay | Admitting: Cardiothoracic Surgery

## 2018-04-16 ENCOUNTER — Other Ambulatory Visit: Payer: Self-pay

## 2018-04-16 ENCOUNTER — Inpatient Hospital Stay (HOSPITAL_COMMUNITY): Payer: PRIVATE HEALTH INSURANCE

## 2018-04-16 LAB — GLUCOSE, CAPILLARY
Glucose-Capillary: 102 mg/dL — ABNORMAL HIGH (ref 70–99)
Glucose-Capillary: 103 mg/dL — ABNORMAL HIGH (ref 70–99)
Glucose-Capillary: 103 mg/dL — ABNORMAL HIGH (ref 70–99)
Glucose-Capillary: 104 mg/dL — ABNORMAL HIGH (ref 70–99)
Glucose-Capillary: 106 mg/dL — ABNORMAL HIGH (ref 70–99)
Glucose-Capillary: 108 mg/dL — ABNORMAL HIGH (ref 70–99)
Glucose-Capillary: 114 mg/dL — ABNORMAL HIGH (ref 70–99)
Glucose-Capillary: 114 mg/dL — ABNORMAL HIGH (ref 70–99)
Glucose-Capillary: 115 mg/dL — ABNORMAL HIGH (ref 70–99)
Glucose-Capillary: 116 mg/dL — ABNORMAL HIGH (ref 70–99)
Glucose-Capillary: 121 mg/dL — ABNORMAL HIGH (ref 70–99)
Glucose-Capillary: 127 mg/dL — ABNORMAL HIGH (ref 70–99)
Glucose-Capillary: 145 mg/dL — ABNORMAL HIGH (ref 70–99)
Glucose-Capillary: 98 mg/dL (ref 70–99)

## 2018-04-16 LAB — CBC
HCT: 29.9 % — ABNORMAL LOW (ref 39.0–52.0)
HCT: 32.1 % — ABNORMAL LOW (ref 39.0–52.0)
Hemoglobin: 10.1 g/dL — ABNORMAL LOW (ref 13.0–17.0)
Hemoglobin: 10.9 g/dL — ABNORMAL LOW (ref 13.0–17.0)
MCH: 31.6 pg (ref 26.0–34.0)
MCH: 32.2 pg (ref 26.0–34.0)
MCHC: 33.8 g/dL (ref 30.0–36.0)
MCHC: 34 g/dL (ref 30.0–36.0)
MCV: 93.4 fL (ref 80.0–100.0)
MCV: 94.7 fL (ref 80.0–100.0)
Platelets: 137 10*3/uL — ABNORMAL LOW (ref 150–400)
Platelets: 123 10*3/uL — ABNORMAL LOW (ref 150–400)
RBC: 3.2 MIL/uL — ABNORMAL LOW (ref 4.22–5.81)
RBC: 3.39 MIL/uL — ABNORMAL LOW (ref 4.22–5.81)
RDW: 12.6 % (ref 11.5–15.5)
RDW: 12.8 % (ref 11.5–15.5)
WBC: 7.8 10*3/uL (ref 4.0–10.5)
WBC: 9.2 10*3/uL (ref 4.0–10.5)
nRBC: 0 % (ref 0.0–0.2)
nRBC: 0 % (ref 0.0–0.2)

## 2018-04-16 LAB — BASIC METABOLIC PANEL
ANION GAP: 3 — AB (ref 5–15)
Anion gap: 9 (ref 5–15)
BUN: 13 mg/dL (ref 8–23)
BUN: 13 mg/dL (ref 8–23)
CALCIUM: 8.4 mg/dL — AB (ref 8.9–10.3)
CO2: 24 mmol/L (ref 22–32)
CO2: 22 mmol/L (ref 22–32)
Calcium: 8.1 mg/dL — ABNORMAL LOW (ref 8.9–10.3)
Chloride: 109 mmol/L (ref 98–111)
Chloride: 105 mmol/L (ref 98–111)
Creatinine, Ser: 0.72 mg/dL (ref 0.61–1.24)
Creatinine, Ser: 0.89 mg/dL (ref 0.61–1.24)
GFR calc Af Amer: >60 mL/min (ref >60)
GFR calc Af Amer: >60 mL/min (ref >60)
GFR calc non Af Amer: >60 mL/min (ref >60)
GFR calc non Af Amer: >60 mL/min (ref >60)
Glucose, Bld: 107 mg/dL — ABNORMAL HIGH (ref 70–99)
Glucose, Bld: 154 mg/dL — ABNORMAL HIGH (ref 70–99)
Potassium: 4.1 mmol/L (ref 3.5–5.1)
Potassium: 4.1 mmol/L (ref 3.5–5.1)
Sodium: 136 mmol/L (ref 135–145)
Sodium: 136 mmol/L (ref 135–145)

## 2018-04-16 LAB — MAGNESIUM
Magnesium: 2.4 mg/dL (ref 1.7–2.4)
Magnesium: 2.2 mg/dL (ref 1.7–2.4)

## 2018-04-16 MED ORDER — SIMETHICONE 80 MG PO CHEW
80.0000 mg | CHEWABLE_TABLET | Freq: Four times a day (QID) | ORAL | Status: DC
  Administered 2018-04-16 – 2018-04-20 (×17): 80 mg via ORAL
  Filled 2018-04-16 (×17): qty 1

## 2018-04-16 MED ORDER — INSULIN ASPART 100 UNIT/ML ~~LOC~~ SOLN
0.0000 [IU] | SUBCUTANEOUS | Status: DC
  Administered 2018-04-16 – 2018-04-17 (×3): 2 [IU] via SUBCUTANEOUS
  Administered 2018-04-17: 4 [IU] via SUBCUTANEOUS

## 2018-04-16 MED ORDER — METOCLOPRAMIDE HCL 5 MG/ML IJ SOLN
10.0000 mg | Freq: Four times a day (QID) | INTRAMUSCULAR | Status: DC
  Administered 2018-04-16 – 2018-04-20 (×14): 10 mg via INTRAVENOUS
  Filled 2018-04-16 (×14): qty 2

## 2018-04-16 MED ORDER — INSULIN ASPART 100 UNIT/ML ~~LOC~~ SOLN: 0.0000 [IU] | SUBCUTANEOUS | Status: DC

## 2018-04-16 MED ORDER — ENOXAPARIN SODIUM 40 MG/0.4ML ~~LOC~~ SOLN
40.0000 mg | Freq: Every day | SUBCUTANEOUS | Status: DC
  Administered 2018-04-16 – 2018-04-19 (×4): 40 mg via SUBCUTANEOUS
  Filled 2018-04-16 (×4): qty 0.4

## 2018-04-16 MED ORDER — FUROSEMIDE 10 MG/ML IJ SOLN
20.0000 mg | Freq: Two times a day (BID) | INTRAMUSCULAR | Status: DC
  Administered 2018-04-16 – 2018-04-17 (×3): 20 mg via INTRAVENOUS
  Filled 2018-04-16 (×3): qty 2

## 2018-04-16 MED ORDER — POTASSIUM CHLORIDE CRYS ER 20 MEQ PO TBCR
20.0000 meq | EXTENDED_RELEASE_TABLET | Freq: Two times a day (BID) | ORAL | Status: DC
  Administered 2018-04-16 – 2018-04-18 (×5): 20 meq via ORAL
  Filled 2018-04-16 (×5): qty 1

## 2018-04-16 MED FILL — Heparin Sodium (Porcine) Inj 1000 Unit/ML: INTRAMUSCULAR | Qty: 30 | Status: AC

## 2018-04-16 MED FILL — Potassium Chloride Inj 2 mEq/ML: INTRAVENOUS | Qty: 40 | Status: AC

## 2018-04-16 MED FILL — Magnesium Sulfate Inj 50%: INTRAMUSCULAR | Qty: 10 | Status: AC

## 2018-04-16 NOTE — Progress Notes (Signed)
CT surgery p.m. Rounds Patient examined and record reviewed.Hemodynamics stable,labs satisfactory.Patient had stable day.Continue current care. Eddie Nelson 04/16/2018   

## 2018-04-16 NOTE — Plan of Care (Signed)
  Problem: Cardiac: Goal: Will achieve and/or maintain hemodynamic stability Outcome: Progressing  CI >2, Pt on neo gtt Problem: Clinical Measurements: Goal: Postoperative complications will be avoided or minimized Outcome: Progressing  All outcomes WNL currently Problem: Respiratory: Goal: Respiratory status will improve Outcome: Progressing  Pt extubated to 4 l O2. Pt on 2l O2 currently

## 2018-04-16 NOTE — Progress Notes (Addendum)
1 Day Post-Op Procedure(s) (LRB): CORONARY ARTERY BYPASS GRAFTING (CABG), ON PUMP, TIMES THREE, USING LEFT INTERNAL MAMMARY ARTERY  AND ENDOSCOPICALLY HARVESTED LEFT GREATER SAPHENOUS VEIN (N/A) TRANSESOPHAGEAL ECHOCARDIOGRAM (TEE) (N/A) Subjective: Extubated routinely.  He is awake and alert this morning.  Complaining of low back pain which is chronic.  Objective: Vital signs in last 24 hours: Temp:  [96.3 F (35.7 C)-100.2 F (37.9 C)] 99.9 F (37.7 C) (03/03 0600) Pulse Rate:  [72-89] 72 (03/03 0600) Cardiac Rhythm: Atrial paced (03/02 2000) Resp:  [11-24] 19 (03/03 0600) BP: (87-114)/(56-70) 100/58 (03/03 0600) SpO2:  [92 %-100 %] 95 % (03/03 0600) Arterial Line BP: (88-148)/(41-70) 123/48 (03/03 0600) FiO2 (%):  [50 %] 50 % (03/02 1252) Weight:  [93.6 kg] 93.6 kg (03/03 0439)  Hemodynamic parameters for last 24 hours: PAP: (15-28)/(7-19) 24/10 CO:  [3.6 L/min-6.7 L/min] 6.6 L/min CI:  [1.8 L/min/m2-3.3 L/min/m2] 3.2 L/min/m2  Intake/Output from previous day: 03/02 0701 - 03/03 0700 In: 5427.1 [P.O.:120; I.V.:3655.1; Blood:160; IV Piggyback:1492] Out: 3780 [Urine:3040; Blood:300; Chest Tube:440] Intake/Output this shift: No intake/output data recorded.  General appearance: alert, cooperative and mild distress Heart: regular rate and rhythm.  Stable hemodynamics.  He is on no vasoactive drips. Lungs: Breath sounds are clear anterior but diminished laterally.  Chest tube drainage approximately 250 mL for the past 12 hours. Wound: Sternal incision is covered with a clean Aquasol dressing.  Left lower extremity incision is clean and dry. Extremities: Mild peripheral edema.  All are well perfused.  Lab Results: Recent Labs    04/15/18 1840 04/15/18 1847 04/16/18 0344  WBC 8.7  --  7.8  HGB 11.2* 10.5* 10.1*  HCT 32.6* 31.0* 29.9*  PLT 141*  --  137*   BMET:  Recent Labs    04/15/18 0421  04/15/18 1254  04/15/18 1840 04/15/18 1847 04/16/18 0344  NA 138   < >  141   < >  --  140 136  K 4.3   < > 3.9   < >  --  4.1 4.1  CL 102  --   --   --   --   --  109  CO2 29  --   --   --   --   --  24  GLUCOSE 112*   < > 120*  --   --   --  107*  BUN 16  --   --   --   --   --  13  CREATININE 0.88  --   --   --  0.79  --  0.72  CALCIUM 9.5  --   --   --   --   --  8.1*   < > = values in this interval not displayed.    PT/INR:  Recent Labs    04/15/18 1253  LABPROT 15.1  INR 1.2   ABG    Component Value Date/Time   PHART 7.333 (L) 04/15/2018 1847   HCO3 24.9 04/15/2018 1847   TCO2 26 04/15/2018 1847   ACIDBASEDEF 1.0 04/15/2018 1847   O2SAT 97.0 04/15/2018 1847   CBG (last 3)  Recent Labs    04/16/18 0122 04/16/18 0231 04/16/18 0340  GLUCAP 103* 102* 98    Assessment/Plan: S/P Procedure(s) (LRB): CORONARY ARTERY BYPASS GRAFTING (CABG), ON PUMP, TIMES THREE, USING LEFT INTERNAL MAMMARY ARTERY  AND ENDOSCOPICALLY HARVESTED LEFT GREATER SAPHENOUS VEIN (N/A) TRANSESOPHAGEAL ECHOCARDIOGRAM (TEE) (N/A)  -Postop day 1 CABG x3 for asymptomatic severe three-vessel coronary artery  disease discovered during preoperative screening prior to back surgery.  Stable hemodynamics.  Requiring no vasoactive support.  Will DC the PA catheter and arterial line.  Possibly remove chest tubes later.  Mobilize.  -Mild hypertension-on lisinopril 10 mg daily prior to surgery.  Will initiate low-dose beta-blocker for now and monitor -Dyslipidemia-resume statin therapy  -Expected acute blood loss anemia, mild.  No indication for transfusion.  Monitor.  -DVT prophylaxis continue SCDs.  Initiate subcu enoxaparin once chest tubes are removed.  -Chronic back pain, was taking Flexeril 3 times a day prior to his surgery.  We will hold off on starting this until he is not requiring IV narcotics.  -Endo-not a known diabetic but HA1C  6.3 pre-op.  Glucose control adequate.  Continue sliding scale insulin for now.   LOS: 5 days    Leary Roca,  [place] [phone] 04/16/2018   patient examined and medical record reviewed,agree with above note. Kathlee Nations Trigt III 04/16/2018

## 2018-04-17 ENCOUNTER — Inpatient Hospital Stay (HOSPITAL_COMMUNITY): Payer: PRIVATE HEALTH INSURANCE

## 2018-04-17 LAB — CBC
HCT: 30.4 % — ABNORMAL LOW (ref 39.0–52.0)
Hemoglobin: 10.2 g/dL — ABNORMAL LOW (ref 13.0–17.0)
MCH: 31.6 pg (ref 26.0–34.0)
MCHC: 33.6 g/dL (ref 30.0–36.0)
MCV: 94.1 fL (ref 80.0–100.0)
Platelets: 120 10*3/uL — ABNORMAL LOW (ref 150–400)
RBC: 3.23 MIL/uL — ABNORMAL LOW (ref 4.22–5.81)
RDW: 12.7 % (ref 11.5–15.5)
WBC: 9.3 10*3/uL (ref 4.0–10.5)
nRBC: 0 % (ref 0.0–0.2)

## 2018-04-17 LAB — GLUCOSE, CAPILLARY
Glucose-Capillary: 130 mg/dL — ABNORMAL HIGH (ref 70–99)
Glucose-Capillary: 127 mg/dL — ABNORMAL HIGH (ref 70–99)
Glucose-Capillary: 165 mg/dL — ABNORMAL HIGH (ref 70–99)
Glucose-Capillary: 124 mg/dL — ABNORMAL HIGH (ref 70–99)
Glucose-Capillary: 102 mg/dL — ABNORMAL HIGH (ref 70–99)
Glucose-Capillary: 126 mg/dL — ABNORMAL HIGH (ref 70–99)

## 2018-04-17 LAB — BASIC METABOLIC PANEL
Anion gap: 6 (ref 5–15)
BUN: 9 mg/dL (ref 8–23)
CHLORIDE: 101 mmol/L (ref 98–111)
CO2: 27 mmol/L (ref 22–32)
Calcium: 8.4 mg/dL — ABNORMAL LOW (ref 8.9–10.3)
Creatinine, Ser: 0.68 mg/dL (ref 0.61–1.24)
GFR calc Af Amer: >60 mL/min (ref >60)
GFR calc non Af Amer: >60 mL/min (ref >60)
Glucose, Bld: 127 mg/dL — ABNORMAL HIGH (ref 70–99)
Potassium: 4.2 mmol/L (ref 3.5–5.1)
Sodium: 134 mmol/L — ABNORMAL LOW (ref 135–145)

## 2018-04-17 MED ORDER — METOPROLOL TARTRATE 25 MG PO TABS
25.0000 mg | ORAL_TABLET | Freq: Two times a day (BID) | ORAL | Status: DC
  Administered 2018-04-17 – 2018-04-18 (×3): 25 mg via ORAL
  Filled 2018-04-17 (×3): qty 1

## 2018-04-17 MED ORDER — INSULIN ASPART 100 UNIT/ML ~~LOC~~ SOLN
0.0000 [IU] | Freq: Three times a day (TID) | SUBCUTANEOUS | Status: DC
  Administered 2018-04-17 (×2): 2 [IU] via SUBCUTANEOUS

## 2018-04-17 MED ORDER — SODIUM CHLORIDE 0.9% FLUSH: 3.0000 mL | INTRAVENOUS | Status: DC | PRN

## 2018-04-17 MED ORDER — MAGNESIUM HYDROXIDE 400 MG/5ML PO SUSP: 30.0000 mL | Freq: Every day | ORAL | Status: DC | PRN

## 2018-04-17 MED ORDER — FUROSEMIDE 10 MG/ML IJ SOLN
40.0000 mg | Freq: Every day | INTRAMUSCULAR | Status: DC
  Administered 2018-04-17 – 2018-04-18 (×2): 40 mg via INTRAVENOUS
  Filled 2018-04-17 (×2): qty 4

## 2018-04-17 MED ORDER — ATORVASTATIN CALCIUM 80 MG PO TABS
80.0000 mg | ORAL_TABLET | Freq: Every day | ORAL | Status: DC
  Administered 2018-04-17 – 2018-04-19 (×3): 80 mg via ORAL
  Filled 2018-04-17 (×3): qty 1

## 2018-04-17 MED ORDER — CYCLOBENZAPRINE HCL 10 MG PO TABS
10.0000 mg | ORAL_TABLET | Freq: Three times a day (TID) | ORAL | Status: DC
  Administered 2018-04-17 – 2018-04-20 (×10): 10 mg via ORAL
  Filled 2018-04-17 (×10): qty 1

## 2018-04-17 MED ORDER — SODIUM CHLORIDE 0.9% FLUSH
3.0000 mL | Freq: Two times a day (BID) | INTRAVENOUS | Status: DC
  Administered 2018-04-17 – 2018-04-20 (×6): 3 mL via INTRAVENOUS

## 2018-04-17 MED ORDER — METOPROLOL TARTRATE 25 MG/10 ML ORAL SUSPENSION
25.0000 mg | Freq: Two times a day (BID) | ORAL | Status: DC
  Filled 2018-04-17 (×2): qty 10

## 2018-04-17 MED ORDER — SODIUM CHLORIDE 0.9 % IV SOLN: 250.0000 mL | INTRAVENOUS | Status: DC | PRN

## 2018-04-17 MED ORDER — MOVING RIGHT ALONG BOOK
Freq: Once | Status: AC
  Administered 2018-04-17: 13:00:00

## 2018-04-17 NOTE — Progress Notes (Addendum)
Pt received from 2H. Oriented to room and equipment. VSS. Telemetry applied, CCMD notified x2. Midsternal incision with dressing. LLE incision clean, dry, intact. Call bell within reach. Will continue to monitor.   Leonidas Romberg, RN

## 2018-04-17 NOTE — Care Management Note (Signed)
Case Management Note  Patient Details  Name: Eddie Nelson MRN: [id number] Date of Birth: August 04, 1955  Subjective/Objective:  63 yo male presented after an abnormal EKG; s/p cath which showed severe MVD; s/p CABG 04/15/18.                  Action/Plan: CM met with patient to discuss dispositional needs. Patient lived at home alone PTA and was independent with his ADLs. PCP: Cher Nakai; Pharmacy: [facility]; Insurance: [employer]. CM discuss DCP with patient stating he will transition home with his mother/brother, but has concerns d/t his recent CABG and severe back pain. CM explained PT/OT eval is needed for discharge recommendations, with a request for Dr. Prescott Gum to order to eval for needs. CM team will continue to follow.   Expected Discharge Date:                  Expected Discharge Plan:  [place]  In-House Referral:  NA  Discharge planning Services  CM Consult  Post Acute Care Choice:  NA Choice offered to:  NA  DME Arranged:  N/A DME Agency:  NA  HH Arranged:  NA HH Agency:  NA  Status of Service:  In process, will continue to follow  If discussed at Long Length of Stay Meetings, dates discussed:    Additional Comments:  Midge Minium RN, BSN, NCM-BC, ACM-RN [phone] 04/17/2018, 11:24 AM

## 2018-04-17 NOTE — Progress Notes (Addendum)
2 Days Post-Op Procedure(s) (LRB): CORONARY ARTERY BYPASS GRAFTING (CABG), ON PUMP, TIMES THREE, USING LEFT INTERNAL MAMMARY ARTERY  AND ENDOSCOPICALLY HARVESTED LEFT GREATER SAPHENOUS VEIN (N/A) TRANSESOPHAGEAL ECHOCARDIOGRAM (TEE) (N/A) Subjective: Awake and alert.  No new complaints.  Did not rest well due to his chronic back discomfort.  Objective: Vital signs in last 24 hours: Temp:  [97.9 F (36.6 C)-100 F (37.8 C)] 99.7 F (37.6 C) (03/04 0340) Pulse Rate:  [75-92] 87 (03/04 0540) Cardiac Rhythm: Normal sinus rhythm (03/04 0400) Resp:  [0-25] 25 (03/04 0540) BP: (99-142)/(60-74) 114/65 (03/04 0500) SpO2:  [87 %-97 %] 90 % (03/04 0540) Arterial Line BP: (158-176)/(60-64) 176/61 (03/03 1100) Weight:  [92.1 kg] 92.1 kg (03/04 0500)  Hemodynamic parameters for last 24 hours: PAP: (22-65)/(12-24) 65/24  Intake/Output from previous day: 03/03 0701 - 03/04 0700 In: 602 [P.O.:360; I.V.:229.3; IV Piggyback:12.7] Out: 2630 [Urine:2510; Chest Tube:120] Intake/Output this shift: No intake/output data recorded.   General appearance: alert, cooperative and mild distress Heart: regular rate and rhythm.   Lungs: Breath sounds are clear anterior but diminished laterally.  Chest tubes have been removed. Wound: Sternal incision is covered with a clean Aquasol dressing.  Left lower extremity incision is clean and dry. Extremities: Mild peripheral edema.  All are well perfused  Lab Results: Recent Labs    04/16/18 1650 04/17/18 0345  WBC 9.2 9.3  HGB 10.9* 10.2*  HCT 32.1* 30.4*  PLT 123* 120*   BMET:  Recent Labs    04/16/18 1650 04/17/18 0345  NA 136 134*  K 4.1 4.2  CL 105 101  CO2 22 27  GLUCOSE 154* 127*  BUN 13 9  CREATININE 0.89 0.68  CALCIUM 8.4* 8.4*    PT/INR:  Recent Labs    04/15/18 1253  LABPROT 15.1  INR 1.2   ABG    Component Value Date/Time   PHART 7.333 (L) 04/15/2018 1847   HCO3 24.9 04/15/2018 1847   TCO2 26 04/15/2018 1847   ACIDBASEDEF  1.0 04/15/2018 1847   O2SAT 97.0 04/15/2018 1847   CBG (last 3)  Recent Labs    04/16/18 1947 04/16/18 2347 04/17/18 0337  GLUCAP 145* 121* 127*    Assessment/Plan: S/P Procedure(s) (LRB): CORONARY ARTERY BYPASS GRAFTING (CABG), ON PUMP, TIMES THREE, USING LEFT INTERNAL MAMMARY ARTERY  AND ENDOSCOPICALLY HARVESTED LEFT GREATER SAPHENOUS VEIN (N/A) TRANSESOPHAGEAL ECHOCARDIOGRAM (TEE) (N/A)  -Postop day 2 CABG x3 for asymptomatic severe three-vessel coronary artery disease discovered during preoperative screening prior to back surgery.    Stable vital signs and cardiac rhythm.  Chest tubes are out.  Continue diuresis.  Remove Foley catheter,  advance activity. Possible transfer to 4E later today.  -History of hypertension-on lisinopril 10 mg daily prior to surgery.  Will continue low-dose beta-blocker for now and monitor  -Dyslipidemia-resumed statin therapy  -Expected acute blood loss anemia, mild. Hct stable. Monitor.  -DVT prophylaxis continue subcu enoxaparin   -Chronic back pain, was taking Flexeril primarily at bedtime prior to his surgery. Will resume tonight.   -Endo-not a known diabetic but HA1C  6.3 pre-op.  Glucose control adequate.  Continue sliding scale insulin for now.   LOS: 6 days    Leary Roca, PA-C [phone]   resume flexeril for back,DC central line and foley, transfer to 4 E patient examined and medical record reviewed,agree with above note. Kathlee Nations Trigt III 04/17/2018

## 2018-04-18 ENCOUNTER — Inpatient Hospital Stay (HOSPITAL_COMMUNITY): Payer: PRIVATE HEALTH INSURANCE

## 2018-04-18 LAB — BPAM RBC
Blood Product Expiration Date: 202003242359
Blood Product Expiration Date: 202003272359
ISSUE DATE / TIME: 202002291237
Unit Type and Rh: 7300
Unit Type and Rh: 7300

## 2018-04-18 LAB — TYPE AND SCREEN
ABO/RH(D): B POS
Antibody Screen: NEGATIVE
Unit division: 0
Unit division: 0

## 2018-04-18 LAB — CBC
HCT: 27.8 % — ABNORMAL LOW (ref 39.0–52.0)
Hemoglobin: 9.4 g/dL — ABNORMAL LOW (ref 13.0–17.0)
MCH: 31.4 pg (ref 26.0–34.0)
MCHC: 33.8 g/dL (ref 30.0–36.0)
MCV: 93 fL (ref 80.0–100.0)
Platelets: 126 10*3/uL — ABNORMAL LOW (ref 150–400)
RBC: 2.99 MIL/uL — ABNORMAL LOW (ref 4.22–5.81)
RDW: 12.6 % (ref 11.5–15.5)
WBC: 7.8 10*3/uL (ref 4.0–10.5)
nRBC: 0 % (ref 0.0–0.2)

## 2018-04-18 LAB — BASIC METABOLIC PANEL
Anion gap: 9 (ref 5–15)
BUN: 19 mg/dL (ref 8–23)
CO2: 29 mmol/L (ref 22–32)
Calcium: 8.5 mg/dL — ABNORMAL LOW (ref 8.9–10.3)
Chloride: 96 mmol/L — ABNORMAL LOW (ref 98–111)
Creatinine, Ser: 0.86 mg/dL (ref 0.61–1.24)
GFR calc Af Amer: >60 mL/min (ref >60)
GFR calc non Af Amer: >60 mL/min (ref >60)
Glucose, Bld: 120 mg/dL — ABNORMAL HIGH (ref 70–99)
Potassium: 4.1 mmol/L (ref 3.5–5.1)
Sodium: 134 mmol/L — ABNORMAL LOW (ref 135–145)

## 2018-04-18 LAB — GLUCOSE, CAPILLARY
Glucose-Capillary: 114 mg/dL — ABNORMAL HIGH (ref 70–99)
Glucose-Capillary: 102 mg/dL — ABNORMAL HIGH (ref 70–99)
Glucose-Capillary: 146 mg/dL — ABNORMAL HIGH (ref 70–99)

## 2018-04-18 MED ORDER — SORBITOL 70 % PO SOLN
60.0000 mL | Freq: Every day | ORAL | Status: DC | PRN
  Filled 2018-04-18: qty 60

## 2018-04-18 MED ORDER — METOPROLOL TARTRATE 25 MG/10 ML ORAL SUSPENSION
37.5000 mg | Freq: Two times a day (BID) | ORAL | Status: DC
  Filled 2018-04-18 (×4): qty 15

## 2018-04-18 MED ORDER — FUROSEMIDE 40 MG PO TABS
40.0000 mg | ORAL_TABLET | Freq: Every day | ORAL | Status: DC
  Administered 2018-04-19 – 2018-04-20 (×2): 40 mg via ORAL
  Filled 2018-04-18 (×2): qty 1

## 2018-04-18 MED ORDER — SORBITOL 70 % SOLN
60.0000 mL | Freq: Every day | Status: DC | PRN
  Administered 2018-04-18: 60 mL via ORAL
  Filled 2018-04-18 (×3): qty 60

## 2018-04-18 MED ORDER — METOPROLOL TARTRATE 25 MG PO TABS
37.5000 mg | ORAL_TABLET | Freq: Two times a day (BID) | ORAL | Status: DC
  Administered 2018-04-18 – 2018-04-20 (×4): 37.5 mg via ORAL
  Filled 2018-04-18 (×4): qty 1

## 2018-04-18 MED ORDER — POTASSIUM CHLORIDE CRYS ER 20 MEQ PO TBCR
20.0000 meq | EXTENDED_RELEASE_TABLET | Freq: Every day | ORAL | Status: DC
  Administered 2018-04-19 – 2018-04-20 (×2): 20 meq via ORAL
  Filled 2018-04-18 (×2): qty 1

## 2018-04-18 MED ORDER — LISINOPRIL 10 MG PO TABS
10.0000 mg | ORAL_TABLET | Freq: Every day | ORAL | Status: DC
  Administered 2018-04-18 – 2018-04-20 (×3): 10 mg via ORAL
  Filled 2018-04-18 (×3): qty 1

## 2018-04-18 MED FILL — Sodium Chloride IV Soln 0.9%: INTRAVENOUS | Qty: 2000 | Status: AC

## 2018-04-18 MED FILL — Lidocaine HCl(Cardiac) IV PF Soln Pref Syr 100 MG/5ML (2%): INTRAVENOUS | Qty: 5 | Status: AC

## 2018-04-18 MED FILL — Heparin Sodium (Porcine) Inj 1000 Unit/ML: INTRAMUSCULAR | Qty: 10 | Status: AC

## 2018-04-18 MED FILL — Electrolyte-R (PH 7.4) Solution: INTRAVENOUS | Qty: 3000 | Status: AC

## 2018-04-18 MED FILL — Mannitol IV Soln 20%: INTRAVENOUS | Qty: 500 | Status: AC

## 2018-04-18 NOTE — Progress Notes (Signed)
CARDIAC REHAB PHASE I   PRE:  Rate/Rhythm: 101 ST  BP:  Sitting: 125/75      SaO2: 84 RA ---> 90 2L  MODE:  Ambulation: 420 ft 116 peak HR  POST:  Rate/Rhythm: 105 ST  BP:  Sitting: 146/73    SaO2: 98 3L ---> 93 2L   Pt ambulated 445ft in hallway standby assist with front wheel walker. Pt denies CP or SOB, states back pain is manageable. Stressed importance of IS use with pt, pt only getting IS to 625, preop was >2250. Pt also encouraged to ask for help and not leave oxygen off after using restroom. Reinforced sternal precautions with pt. Pt continues to want to push up to get out of seated position. Pt in recliner, call bell and phone within reach. Encouraged a third walk later today. Will continue to follow.  [phone] Reynold Bowen, RN BSN 04/18/2018 11:42 AM

## 2018-04-18 NOTE — Discharge Instructions (Signed)
°Discharge Instructions: ° °1. You may shower, please wash incisions daily with soap and water and keep dry.  If you wish to cover wounds with dressing you may do so but please keep clean and change daily.  No tub baths or swimming until incisions have completely healed.  If your incisions become red or develop any drainage please call our office at [phone] ° °2. No Driving until cleared by Dr. Van Trigt's office and you are no longer using narcotic pain medications ° °3. Monitor your weight daily.. Please use the same scale and weigh at same time... If you gain 5-10 lbs in 48 hours with associated lower extremity swelling, please contact our office at [phone] ° °4. Fever of 101.5 for at least 24 hours with no source, please contact our office at [phone] ° °5. Activity- up as tolerated, please walk at least 3 times per day.  Avoid strenuous activity, no lifting, pushing, or pulling with your arms over 8-10 lbs for a minimum of 6 weeks ° °6. If any questions or concerns arise, please do not hesitate to contact our office at [phone] ° °Prediabetes Eating Plan °Prediabetes is a condition that causes blood sugar (glucose) levels to be higher than normal. This increases the risk for developing diabetes. In order to prevent diabetes from developing, your health care provider may recommend a diet and other lifestyle changes to help you: °· Control your blood glucose levels. °· Improve your cholesterol levels. °· Manage your blood pressure. °Your health care provider may recommend working with a diet and nutrition specialist (dietitian) to make a meal plan that is best for you. °What are tips for following this plan? °Lifestyle °· Set weight loss goals with the help of your health care team. It is recommended that most people with prediabetes lose 7% of their current body weight. °· Exercise for at least 30 minutes at least 5 days a week. °· Attend a support group or seek ongoing support from a mental  health counselor. °· Take over-the-counter and prescription medicines only as told by your health care provider. °Reading food labels °· Read food labels to check the amount of fat, salt (sodium), and sugar in prepackaged foods. Avoid foods that have: °? Saturated fats. °? Trans fats. °? Added sugars. °· Avoid foods that have more than 300 milligrams (mg) of sodium per serving. Limit your daily sodium intake to less than 2,300 mg each day. °Shopping °· Avoid buying pre-made and processed foods. °Cooking °· Cook with olive oil. Do not use butter, lard, or ghee. °· Bake, broil, grill, or boil foods. Avoid frying. °Meal planning ° °· Work with your dietitian to develop an eating plan that is right for you. This may include: °? Tracking how many calories you take in. Use a food diary, notebook, or mobile application to track what you eat at each meal. °? Using the glycemic index (GI) to plan your meals. The index tells you how quickly a food will raise your blood glucose. Choose low-GI foods. These foods take a longer time to raise blood glucose. °· Consider following a Mediterranean diet. This diet includes: °? Several servings each day of fresh fruits and vegetables. °? Eating fish at least twice a week. °? Several servings each day of whole grains, beans, nuts, and seeds. °? Using olive oil instead of other fats. °? Moderate alcohol consumption. °? Eating small amounts of red meat and whole-fat dairy. °· If you have high blood pressure, you may need to   limit your sodium intake or follow a diet such as the DASH eating plan. DASH is an eating plan that aims to lower high blood pressure. °What foods are recommended? °The items listed below may not be a complete list. Talk with your dietitian about what dietary choices are best for you. °Grains °Whole grains, such as whole-wheat or whole-grain breads, crackers, cereals, and pasta. Unsweetened oatmeal. Bulgur. Barley. Quinoa. Brown rice. Corn or whole-wheat flour  tortillas or taco shells. °Vegetables °Lettuce. Spinach. Peas. Beets. Cauliflower. Cabbage. Broccoli. Carrots. Tomatoes. Squash. Eggplant. Herbs. Peppers. Onions. Cucumbers. Brussels sprouts. °Fruits °Berries. Bananas. Apples. Oranges. Grapes. Papaya. Mango. Pomegranate. Kiwi. Grapefruit. Cherries. °Meats and other protein foods °Seafood. Poultry without skin. Lean cuts of pork and beef. Tofu. Eggs. Nuts. Beans. °Dairy °Low-fat or fat-free dairy products, such as yogurt, cottage cheese, and cheese. °Beverages °Water. Tea. Coffee. Sugar-free or diet soda. Seltzer water. Lowfat or no-fat milk. Milk alternatives, such as soy or almond milk. °Fats and oils °Olive oil. Canola oil. Sunflower oil. Grapeseed oil. Avocado. Walnuts. °Sweets and desserts °Sugar-free or low-fat pudding. Sugar-free or low-fat ice cream and other frozen treats. °Seasoning and other foods °Herbs. Sodium-free spices. Mustard. Relish. Low-fat, low-sugar ketchup. Low-fat, low-sugar barbecue sauce. Low-fat or fat-free mayonnaise. °What foods are not recommended? °The items listed below may not be a complete list. Talk with your dietitian about what dietary choices are best for you. °Grains °Refined white flour and flour products, such as bread, pasta, snack foods, and cereals. °Vegetables °Canned vegetables. Frozen vegetables with butter or cream sauce. °Fruits °Fruits canned with syrup. °Meats and other protein foods °Fatty cuts of meat. Poultry with skin. Breaded or fried meat. Processed meats. °Dairy °Full-fat yogurt, cheese, or milk. °Beverages °Sweetened drinks, such as sweet iced tea and soda. °Fats and oils °Butter. Lard. Ghee. °Sweets and desserts °Baked goods, such as cake, cupcakes, pastries, cookies, and cheesecake. °Seasoning and other foods °Spice mixes with added salt. Ketchup. Barbecue sauce. Mayonnaise. °Summary °· To prevent diabetes from developing, you may need to make diet and other lifestyle changes to help control blood sugar,  improve cholesterol levels, and manage your blood pressure. °· Set weight loss goals with the help of your health care team. It is recommended that most people with prediabetes lose 7 percent of their current body weight. °· Consider following a Mediterranean diet that includes plenty of fresh fruits and vegetables, whole grains, beans, nuts, seeds, fish, lean meat, low-fat dairy, and healthy oils. °This information is not intended to replace advice given to you by your health care provider. Make sure you discuss any questions you have with your health care provider. °Document Released: 06/16/2014 Document Revised: 04/05/2016 Document Reviewed: 04/05/2016 °[employer]vier Interactive Patient Education © 2019 [employer] ° °

## 2018-04-18 NOTE — Progress Notes (Addendum)
      [address]       [place] [address]             [phone]        3 Days Post-Op Procedure(s) (LRB): CORONARY ARTERY BYPASS GRAFTING (CABG), ON PUMP, TIMES THREE, USING LEFT INTERNAL MAMMARY ARTERY  AND ENDOSCOPICALLY HARVESTED LEFT GREATER SAPHENOUS VEIN (N/A) TRANSESOPHAGEAL ECHOCARDIOGRAM (TEE) (N/A)  Subjective: Patient has not had a bowel movement yet  Objective: Vital signs in last 24 hours: Temp:  [97.7 F (36.5 C)-99 F (37.2 C)] 98.2 F (36.8 C) (03/05 0854) Pulse Rate:  [78-113] 113 (03/05 0854) Cardiac Rhythm: Normal sinus rhythm (03/05 0914) Resp:  [14-26] 26 (03/05 0854) BP: (100-163)/(61-86) 163/86 (03/05 0854) SpO2:  [90 %-95 %] 90 % (03/05 0854) Weight:  [91.4 kg] 91.4 kg (03/05 0404)  Pre op weight 89.2 kg Current Weight  04/18/18 91.4 kg      Intake/Output from previous day: 03/04 0701 - 03/05 0700 In: 690 [P.O.:680] Out: 2350 [Urine:2350]   Physical Exam:  Cardiovascular: Slightly tachycardic Pulmonary: Slightly diminished at bases. Abdomen: Soft, non tender, bowel sounds present. Extremities: Mild bilateral lower extremity edema L>R Wounds: Aquacel removed and sternal wound is clean and dry.  No erythema or signs of infection. LLE wound is clean and dry  Lab Results: CBC: Recent Labs    04/17/18 0345 04/18/18 0723  WBC 9.3 7.8  HGB 10.2* 9.4*  HCT 30.4* 27.8*  PLT 120* 126*   BMET:  Recent Labs    04/17/18 0345 04/18/18 0723  NA 134* 134*  K 4.2 4.1  CL 101 96*  CO2 27 29  GLUCOSE 127* 120*  BUN 9 19  CREATININE 0.68 0.86  CALCIUM 8.4* 8.5*    PT/INR:  Lab Results  Component Value Date   INR 1.2 04/15/2018   INR 0.9 04/13/2018   INR 1.0 04/12/2018   ABG:  INR: Will add last result for INR, ABG once components are confirmed Will add last 4 CBG results once components are confirmed  Assessment/Plan:  1. CV - Tachycardia and hypertensive. On Lopressor 25 mg bid. Will increase Lopressor  and restart Lisinopril. 2.  Pulmonary - On room air. CXR this am shows mild bibasilar atelectasis and small pleural effusions. Encourage incentive spirometer 3. Volume Overload - On Lasix 40 mg IV daily. Will change to oral in am 4.  Acute blood loss anemia - H and H 9.4 and 27.8 this am 5. CBGs 126/114/102. Pre op HGA1C 6.3. He likely has pre diabetes. Will need further medical follow up after discharge. Will provide nutrition recommendations with discharge paperwork. 6. Mild thrombocytopenia-platelets 126, 000 7. Remove EPW in AM 8. LOC in am if no bowel movement 9. Likely home Saturday  Lelon Huh ZimmermanPA-C 04/18/2018,10:28 AM [phone]

## 2018-04-18 NOTE — Progress Notes (Addendum)
Patient ambulated in hallway with nursing staff and walker. Patient walked with oxygen. Oxygen at rest with o2 at 2 L prior to walk was 95 % . Patient tolerated well will continue to monitor patient. Brittnee Gaetano, Randall An rN

## 2018-04-19 NOTE — Progress Notes (Signed)
Patient up to chair this AM for breakfast. Patient still requiring 1 L of 02 at rest. Will continue to wean patient. Josefine Fuhr, Randall An rN

## 2018-04-19 NOTE — Discharge Summary (Addendum)
Physician Discharge Summary  Patient ID: Eddie Nelson MRN: [id number] DOB/AGE: May 26, 1955 63 y.o.  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 During this visit, he had an abnormal EKG.  He was sent to Dr. Kem Parkinson office for further evaluation.  The patient underwent an exercise stress Cardiolite test.  This showed that he had a fair exercise capacity but he did have a hypertensive response and significant area of anterior septum was found to be in jeopardy.  Dr. Tomie China recommended left heart catheterization and this was performed on 04/11/18.  The study showed 80% stenosis of the proximal RCA, 95% stenosis of the mid RCA, 99% stenosis of the PDA, 50% stenosis of the proximal circumflex, 90% stenosis of the ostial first margin, and 95% stenosis of the proximal to mid LAD.  He had a estimated left ejection fraction of 50 to 55%.  There is no recent echocardiogram. The patient did not have any symptoms such as shortness of breath or chest pain. He was not having any chest pain at the time of admission. We were consulted for possible revascularization.   Hospital Course:  After review of clinical data and coronary angiography, coronary bypass grafting was offered to the patient and he elected to proceed with surgery.  He was taken to  the operating room on 04/15/2018  where three-vessel where three-vessel coronary bypass grafting was accomplished.  Left internal mammary artery was grafted to the left anterior descending coronary artery and saphenous veins were grafted to the first obtuse marginal and posterior descending coronary arteries utilizing endoscopically harvested saphenous vein.  He tolerated the procedure well.  Following surgery, he was transferred to the cardiovascular ICU.  He was extubated routinely.  Vital signs and hemodynamics remained satisfactory.  He was weaned from vasopressor support without difficulty.  He was mobilized.  Diet was initiated, advanced, and well-tolerated.  He was diuresed for perioperative volume overload.  Responded well to this and was able to wean off of the supplemental oxygen without difficulty.  His cardiac rhythm remained stable in a normal sinus mechanism.  Glucose was monitored and sliding scale insulin provided for his diet-controlled diabetes mellitus.  He is preoperative hemoglobin A1c was 6.3.  We have encouraged follow-up with his primary care provider regarding careful management of his diabetes.   Consults: None  Significant Diagnostic Studies:  ECHOCARDIOGRAM REPORT       Patient Name:   Eddie Nelson Date of Exam: 04/12/2018 Medical Rec #:  [id number]           Height:       69.0 in Accession #:    [id number]          Weight:       198.2 lb Date of Birth:  1955-12-17           BSA:          2.06 m Patient Age:    66 years  BP:           131/77 mmHg Patient Gender: M                   HR:           66 bpm. Exam Location:  Inpatient    Procedure: 2D Echo  Indications:    CAD (coronary artery disease)   History:        Patient has no prior history of Echocardiogram examinations.                 Risk Factors: Diabetes, Hypertension and Dyslipidemia.   Sonographer:    Ross Ludwig RDCS (AE) Referring Phys: [id number] Donica Derouin VAN TRIGT  IMPRESSIONS    1.  The left ventricle has normal systolic function, with an ejection fraction of 55-60%. The cavity size was normal. There is mildly increased left ventricular wall thickness. Left ventricular diastolic Doppler parameters are consistent with impaired  relaxation Indeterminent filling pressures The E/e' is 8-15.  2. The right ventricle has normal systolic function. The cavity was normal. There is no increase in right ventricular wall thickness.  3. The mitral valve is normal in structure.  4. The tricuspid valve is normal in structure.  5. The aortic valve is normal in structure. no stenosis of the aortic valve.  6. The aortic root and ascending aorta are normal in size and structure.  SUMMARY   LVEF 55-60%, mild LVH, normal wall motion, grade 1 DD, indeterminate LV filling pressure, normal biatrial size, trivial TR, RVSP 19 mmHg, normal IVC  FINDINGS  Left Ventricle: The left ventricle has normal systolic function, with an ejection fraction of 55-60%. The cavity size was normal. There is mildly increased left ventricular wall thickness. Left ventricular diastolic Doppler parameters are consistent  with impaired relaxation Indeterminent filling pressures The E/e' is 8-15. Right Ventricle: The right ventricle has normal systolic function. The cavity was normal. There is no increase in right ventricular wall thickness. Left Atrium: left atrial size was normal in size Right Atrium: right atrial size was normal in size. Right atrial pressure is estimated at 3 mmHg. Interatrial Septum: No atrial level shunt detected by color flow Doppler. Pericardium: There is no evidence of pericardial effusion. Mitral Valve: The mitral valve is normal in structure. Mitral valve regurgitation is trivial by color flow Doppler. Tricuspid Valve: The tricuspid valve is normal in structure. Tricuspid valve regurgitation is trivial by color flow Doppler. Aortic Valve: The aortic valve is normal in structure. Aortic valve  regurgitation was not visualized by color flow Doppler. There is no stenosis of the aortic valve, with a calculated valve area of 2.42 cm. Pulmonic Valve: The pulmonic valve was grossly normal. Pulmonic valve regurgitation is not visualized by color flow Doppler. Aorta: The aortic root and ascending aorta are normal in size and structure. Venous: The inferior vena cava measures 1.68 cm, is normal in size with greater than 50% respiratory variability.   LEFT VENTRICLE PLAX 2D (Teich) LV EF:          58.9 %   Diastology LVIDd:          5.10 cm  LV e' lateral:   9.14 cm/s LVIDs:          3.50 cm  LV E/e' lateral: 5.9 LV PW:          1.00 cm  LV e' medial:    7.40 cm/s LV IVS:         1.00  cm  LV E/e' medial:  7.3 LVOT diam:      2.00 cm LV SV:          73 ml LVOT Area:      3.14 cm  RIGHT VENTRICLE RV S prime:     10.00 cm/s TAPSE (M-mode): 1.8 cm RVSP:           18.7 mmHg  LEFT ATRIUM             Index       RIGHT ATRIUM           Index LA diam:        3.80 cm 1.85 cm/m  RA Pressure: 3 mmHg LA Vol (A2C):   49.2 ml 23.91 ml/m RA Area:     14.30 cm LA Vol (A4C):   34.5 ml 16.76 ml/m RA Volume:   34.80 ml  16.91 ml/m LA Biplane Vol: 42.6 ml 20.70 ml/m  AORTIC VALVE AV Area (Vmax):    2.54 cm AV Area (Vmean):   2.49 cm AV Area (VTI):     2.42 cm AV Vmax:           126.00 cm/s AV Vmean:          89.700 cm/s AV VTI:            0.235 m AV Peak Grad:      6.4 mmHg AV Mean Grad:      4.0 mmHg LVOT Vmax:         102.00 cm/s LVOT Vmean:        71.100 cm/s LVOT VTI:          0.181 m LVOT/AV VTI ratio: 0.77   AORTA Ao Root diam: 2.90 cm  MV E velocity: 53.90 cm/s TRICUSPID VALVE MV A velocity: 44.90 cm/s TR Peak grad:   15.7 mmHg MV E/A ratio:  1.20       TR Vmax:        198.00 cm/s                           RVSP:           18.7 mmHg  IVC IVC diam: 1.68 cm    Zoila Shutter MD Electronically signed by Zoila Shutter MD Signature Date/Time: 04/12/2018/3:05:44  PM   PREOPERATIVE VASCULAR EVALUATION  Indications:      Pre CABG. Risk Factors:     Hypertension. Limitations:      Multiple IV on site Comparison Study: No prior study available  Performing Technologist: Melodie Bouillon RDMS, RVT    Examination Guidelines: A complete evaluation includes B-mode imaging, spectral Doppler, color Doppler, and power Doppler as needed of all accessible portions of each vessel. Bilateral testing is considered an integral part of a complete examination. Limited examinations for reoccurring indications may be performed as noted.    Right Carotid Findings: +----------+--------+-------+--------+----------------------+------------------+           PSV cm/sEDV    StenosisDescribe              Comments                             cm/s                                                    +----------+--------+-------+--------+----------------------+------------------+  CCA Prox  89      24             diffuse and                                                               hyperechoic                              +----------+--------+-------+--------+----------------------+------------------+ CCA Distal92      25             diffuse and           intimal thickening                                  hyperechoic                              +----------+--------+-------+--------+----------------------+------------------+ ICA Prox  71      25     1-39%   hyperechoic and focal                    +----------+--------+-------+--------+----------------------+------------------+ ICA Mid   110     37     40-59%                                           +----------+--------+-------+--------+----------------------+------------------+ ICA Distal106     40                                                      +----------+--------+-------+--------+----------------------+------------------+ ECA       129     15                                                       +----------+--------+-------+--------+----------------------+------------------+  Portions of this table do not appear on this page.    +----------+--------+-------+--------+------------+           PSV cm/sEDV cmsDescribeArm Pressure +----------+--------+-------+--------+------------+ [id number]                                  +----------+--------+-------+--------+------------+  +---------+--------+--+--------+--+---------+ VertebralPSV cm/s55EDV cm/s20Antegrade +---------+--------+--+--------+--+---------+  Left Carotid Findings: +----------+--------+--------+--------+-----------------------+--------+           PSV cm/sEDV cm/sStenosisDescribe               Comments +----------+--------+--------+--------+-----------------------+--------+ CCA Prox  94      19              diffuse and hyperechoic         +----------+--------+--------+--------+-----------------------+--------+ CCA Distal88      24  diffuse and hyperechoic         +----------+--------+--------+--------+-----------------------+--------+ ICA Prox  114     45      40-59%  diffuse and hyperechoic         +----------+--------+--------+--------+-----------------------+--------+ ICA Mid   99      37                                              +----------+--------+--------+--------+-----------------------+--------+ ICA Distal109     42                                              +----------+--------+--------+--------+-----------------------+--------+ ECA       92      14                                              +----------+--------+--------+--------+-----------------------+--------+   +----------+--------+--------+--------+------------+ SubclavianPSV cm/sEDV cm/sDescribeArm Pressure +----------+--------+--------+--------+------------+           125                      120          +----------+--------+--------+--------+------------+  +---------+--------+--+--------+--+----------------------------+ VertebralPSV cm/s47EDV cm/s17Antegrade and High resistant +---------+--------+--+--------+--+----------------------------+    ABI Findings: +--------+------------------+-----+---------+----------+ Right   Rt Pressure (mmHg)IndexWaveform Comment    +--------+------------------+-----+---------+----------+ Brachial                       triphasicIV on site +--------+------------------+-----+---------+----------+ PTA     121               1.01 triphasic           +--------+------------------+-----+---------+----------+ DP      136               1.13 triphasic           +--------+------------------+-----+---------+----------+  +--------+------------------+-----+---------+-------+ Left    Lt Pressure (mmHg)IndexWaveform Comment +--------+------------------+-----+---------+-------+ Brachial120                    triphasic        +--------+------------------+-----+---------+-------+ PTA     146               1.22 triphasic        +--------+------------------+-----+---------+-------+ DP      127               1.06 triphasic        +--------+------------------+-----+---------+-------+  +-------+---------------+----------------+ ABI/TBIToday's ABI/TBIPrevious ABI/TBI +-------+---------------+----------------+ Right  1.13                            +-------+---------------+----------------+ Left   1.22                            +-------+---------------+----------------+    Right Doppler Findings: +--------+--------+-----+---------+----------+ Site    PressureIndexDoppler  Comments   +--------+--------+-----+---------+----------+ Brachial             triphasicIV on site +--------+--------+-----+---------+----------+ Radial  triphasic            +--------+--------+-----+---------+----------+ Ulnar                triphasic           +--------+--------+-----+---------+----------+     Left Doppler Findings: +--------+--------+-----+---------+--------+ Site    PressureIndexDoppler  Comments +--------+--------+-----+---------+--------+ [id number]          triphasic         +--------+--------+-----+---------+--------+ Radial               triphasic         +--------+--------+-----+---------+--------+ Ulnar                triphasic         +--------+--------+-----+---------+--------+     Summary: Right Carotid: Velocities in the right ICA are consistent with a 40-59%                stenosis.  Left Carotid: Velocities in the left ICA are consistent with a 40-59% stenosis. Vertebrals: Bilateral vertebral arteries demonstrate antegrade flow. Left             vertebral artery demonstrates high resistant flow.  Right ABI: Resting right ankle-brachial index is within normal range. No evidence of significant right lower extremity arterial disease. Left ABI: Resting left ankle-brachial index is within normal range. No evidence of significant left lower extremity arterial disease. Right Upper Extremity: Doppler waveforms remain within normal limits with right radial compression. Doppler waveforms decrease 50% with right ulnar compression. Left Upper Extremity: Doppler waveforms decrease <50% w left radial compression. Doppler waveform obliterate with left ulnar compression.   Electronically signed by Lemar Livings MD on 04/12/2018 at 12:08:58 PM.  Left Heart Cath  Coronary Findings   Diagnostic  Dominance: Right  Left Anterior Descending  Prox LAD to Mid LAD lesion 95% stenosed  Prox LAD to Mid LAD lesion is 95% stenosed.  Left Circumflex  Prox Cx lesion 50% stenosed  Prox Cx lesion is 50% stenosed.  First Obtuse Marginal Branch  Ost 1st Mrg lesion 90% stenosed  Ost 1st Mrg lesion is 90%  stenosed.  Right Coronary Artery  Prox RCA lesion 80% stenosed  Prox RCA lesion is 80% stenosed.  Mid RCA lesion 95% stenosed  Mid RCA lesion is 95% stenosed.  Right Posterior Descending Artery  Collaterals  RPDA filled by collaterals from Acute Mrg.    Collaterals  RPDA filled by collaterals from Dist LAD.    Ost RPDA to RPDA lesion 99% stenosed  Ost RPDA to RPDA lesion is 99% stenosed.  Intervention   No interventions have been documented.  Wall Motion   Resting               Left Heart   Left Ventricle The left ventricular ejection fraction is 50-55% by visual estimate. There are LV function abnormalities. There is preserved global LV function with an EF of 50%. There was a focal region of mid inferior hypocontractility telemetry and possible very small focal region of anterolateral hypocontractility. LVEDP is 17 mm.  Coronary Diagrams   Diagnostic  Dominance: Right    Treatments: Surgery  OPERATIVE REPORT  DATE OF PROCEDURE:  04/15/2018  OPERATION: 1.  Coronary artery bypass grafting x3 (left internal mammary artery to left anterior descending, saphenous vein graft to OM1, saphenous vein graft to posterior descending). 2.  Endoscopic harvest of left leg greater saphenous vein.  SURGEON:  Kerin Perna, MD  ASSISTANT:  Jillyn Hidden, PA-C.  ANESTHESIA:  General by Dr. Marguerita Merles.  PREOPERATIVE DIAGNOSIS:  Severe 3-vessel coronary artery disease, positive stress test.  POSTOPERATIVE DIAGNOSIS:  Severe 3-vessel coronary artery disease, positive stress test.  CLINICAL NOTE:  The patient is a 63 year old hypertensive nonsmoker with dyslipidemia and positive family history for CABG.  He had a stress test prior to orthopedic back surgery, which was strongly positive.  Subsequent cardiac catheterization  demonstrated severe 3-vessel coronary disease including total occlusion of the RCA, total occlusion of the LAD and 95% stenosis of the OM1.   LV systolic function was preserved.  Surgical coronary revascularization was recommended.  After reviewing his  angiograms and echocardiogram, I discussed the procedure of CABG for treatment of his CAD.  I discussed the major aspects of the procedure including the use of general anesthesia and cardiopulmonary bypass, the location of the surgical incisions, and the  expected postoperative hospital recovery.  I discussed with the patient his expected recovery as well as the risks to him of CABG including risk of stroke, bleeding, blood transfusion, postoperative infection, postoperative pulmonary problems including  pleural effusion, postoperative organ failure, and death.  After reviewing these issues, he demonstrated his understanding and agreed to proceed with surgery under what I felt was an informed consent.  OPERATIVE FINDINGS: 1.  Severe 3-vessel coronary disease with adequate targets and adequate conduit. 2.  No blood transfusions required. 3.  No inotropes required. 4.  Preservation of LV function after separation from cardiopulmonary bypass by TEE.  Discharge Exam: Blood pressure 139/85, pulse 80, temperature 98.2 F (36.8 C), temperature source Oral, resp. rate 19, height 5\' 9"  (1.753 m), weight 90.1 kg, SpO2 92 %.  -Postop day 4 CABG x3 for multivessel CAD discovered during pre-op clearance for elective back surgery.  He remains hemodynamically stable.  No significant arrhythmias.  Remove pacing wires today.  Continue to advance activity.  -Expected mild postoperative respiratory insufficiency.  Encourage continued pulmonary hygiene.  And oxygen.  Continue daily Lasix.  -Expected acute blood loss anemia. Mild with no indication for transfusion. Hct stable. Monitor.  -Dyslipidemia-statin has been resumed  -Diet controlled Diabetes mellitus- Glucose controlled, continue SSI. Pre-op A1C 6.3.  -DVT prophylaxis-continue enoxaparin   Disposition:   Discharge Instructions     Amb Referral to Cardiac Rehabilitation   Complete by:  As directed    Will send Cardiac Rehab Phase 2 order to [facility]   Diagnosis:  CABG   CABG X ___:  3     Allergies as of 04/20/2018   No Known Allergies     Medication List    TAKE these medications   aspirin 325 MG EC tablet Take 1 tablet (325 mg total) by mouth daily. Start taking on:  April 21, 2018   atorvastatin 80 MG tablet Commonly known as:  LIPITOR Take 1 tablet (80 mg total) by mouth daily at 6 PM.   cyclobenzaprine 10 MG tablet Commonly known as:  FLEXERIL Take 1 tablet by mouth 3 (three) times daily as needed for muscle spasms.   furosemide 40 MG tablet Commonly known as:  LASIX Take 1 tablet (40 mg total) by mouth daily. Take for 5 days then stop. Start taking on:  April 21, 2018   HYDROcodone-acetaminophen 5-325 MG tablet Commonly known as:  NORCO/VICODIN Take 1 tablet by mouth every 6 (six) hours as needed (pain).   lisinopril 10 MG tablet Commonly known as:  PRINIVIL,ZESTRIL Take 1 tablet by mouth daily.   Metoprolol Tartrate 37.5 MG Tabs Take  37.5 mg by mouth 2 (two) times daily.   potassium chloride SA 20 MEQ tablet Commonly known as:  K-DUR,KLOR-CON Take 1 tablet (20 mEq total) by mouth daily. Take 1 by mouth daily for 5 days then stop. Start taking on:  April 21, 2018   traMADol 50 MG tablet Commonly known as:  ULTRAM Take 50 mg by mouth every 6 (six) hours as needed for moderate pain.      Follow-up Information    Simone Curia, MD. Call.   Specialty:  Internal Medicine Why:  for a follow up appointment regarding further surveillance of HGA1C 6.3 (pre diabetes) Contact information: [address]  [place] [address] [phone]        Revankar, Aundra Dubin, MD. Go on 04/26/2018.   Specialty:  Cardiology Why:  @8am  for hospital follow up Contact information: [address] [place] [address] [phone]        Kerin Perna, MD. Go on 05/15/2018.   Specialty:   Cardiothoracic Surgery Why:  You have an appointment with Dr. Maren Beach on Wednesday, 05/15/2018 at 10:45 AM.  Please arrive 30 minutes early for a chest x-ray on the first floor of the same building Contact information: [address] [employer] [address] [place] [address] [phone]         The patient has been discharged on:   1.Beta Blocker:  Yes [  x ]                              No   [   ]                              If No, reason:  2.Ace Inhibitor/ARB: Yes [ x  ]                                     No  [    ]                                     If No, reason:  3.Statin:   Yes [x   ]                  No  [   ]                  If No, reason:  4.Ecasa:  Yes  [ x  ]                  No   [   ]                  If No, reason:    Signed: Leary Roca, PA-C 04/20/2018, 12:38 PM   patient examined and medical record reviewed,agree with above note. Kathlee Nations Trigt III 04/29/2018

## 2018-04-19 NOTE — Progress Notes (Signed)
Patient ambulated in hallway with walker. Will monitor patient. Mana Haberl, Randall An rN

## 2018-04-19 NOTE — Progress Notes (Signed)
Patient EPW pulled per protocol and as ordered. All ends intact. Patient reminded to lie supine approximately one hour. bp 103/54 heart rate 75 on monitor. Will continue to monitor patient. Reyes Aldaco, Randall An RN

## 2018-04-19 NOTE — Progress Notes (Addendum)
4 Days Post-Op Procedure(s) (LRB): CORONARY ARTERY BYPASS GRAFTING (CABG), ON PUMP, TIMES THREE, USING LEFT INTERNAL MAMMARY ARTERY  AND ENDOSCOPICALLY HARVESTED LEFT GREATER SAPHENOUS VEIN (N/A) TRANSESOPHAGEAL ECHOCARDIOGRAM (TEE) (N/A) Subjective: Continues to make progress.  No new problems.  Nearly independent with mobility. Still requiring O2 at 1L/min.  BM yesterday.  Objective: Vital signs in last 24 hours: Temp:  [97.7 F (36.5 C)-98.5 F (36.9 C)] 98 F (36.7 C) (03/06 0614) Pulse Rate:  [71-101] 79 (03/06 0815) Cardiac Rhythm: Normal sinus rhythm (03/06 0830) Resp:  [19-25] 24 (03/06 0815) BP: (106-146)/(58-75) 114/61 (03/06 0815) SpO2:  [93 %-100 %] 97 % (03/06 0815) Weight:  [90.4 kg] 90.4 kg (03/06 0500)     Intake/Output from previous day: 03/05 0701 - 03/06 0700 In: 470 [P.O.:470] Out: 1200 [Urine:1200] Intake/Output this shift: No intake/output data recorded.  General appearance: alert, appears stated age and no distress Heart: regular rate and rhythm Lungs: clear to auscultation bilaterally Wound: The sterntomy and left LE incisions are well approximated and healing with no sign of complication.  Lab Results: Recent Labs    04/17/18 0345 04/18/18 0723  WBC 9.3 7.8  HGB 10.2* 9.4*  HCT 30.4* 27.8*  PLT 120* 126*   BMET:  Recent Labs    04/17/18 0345 04/18/18 0723  NA 134* 134*  K 4.2 4.1  CL 101 96*  CO2 27 29  GLUCOSE 127* 120*  BUN 9 19  CREATININE 0.68 0.86  CALCIUM 8.4* 8.5*    PT/INR: No results for input(s): LABPROT, INR in the last 72 hours. 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  He remains hemodynamically stable.  No significant arrhythmias.  Remove pacing wires today.  Continue to advance activity.  -Expected mild postoperative respiratory insufficiency.  Encourage continued pulmonary hygiene.  And oxygen.  Continue daily Lasix.  -Expected acute blood loss anemia. Mild with no indication for transfusion. Hct stable. Monitor.  -Dyslipidemia-statin has been resumed  -Diet controlled Diabetes mellitus- Glucose controlled, continue SSI. Pre-op A1C 6.3.  -DVT prophylaxis-continue enoxaparin   LOS: 8 days    Leary Roca, Cordelia Poche [phone] 04/19/2018   Chart reviewed, patient examined, agree with above. He has ambulated 3 X today without difficulty using walker.  Planning to go home with his family in the am.

## 2018-04-19 NOTE — Progress Notes (Addendum)
I personally went and discussed the patient's hospitalization with him.  He is doing very well.  On stable regimen.  He is very pleased with the entire transition from being seen in the clinic, stress test throughout his catheterization and then CABG and post CABG care.  Currently on stable regimen.  No new changes recommended  [facility] will sign off.   Medication Recommendations:  Continue current medication with ASA 324mg  daily, Lipitor 80mg  daily, lisinopril 10mg  daily, lopressor 37.5mg  BID Other recommendations (labs, testing, etc):  N/A Follow up as an outpatient:  Has been arranged = Dr. Tomie China in [place] is his primary cardiologist.    Bryan Lemma, MD

## 2018-04-19 NOTE — Progress Notes (Signed)
CARDIAC REHAB PHASE I   PRE:  Rate/Rhythm: 71 SR  BP:  Sitting: 95/56      SaO2: 91 1L --> 91 RA  MODE:  Ambulation: 470 ft   POST:  Rate/Rhythm: 85 SR  BP:  Sitting: 108/61    SaO2: 87 RA --> 93 RA   Pt ambulated 438ft in hallway standby assist with front wheel walker. Pt denies CP or SOB. Pt sats briefly dipped to 87 RA but quickly returned to >90. D/C education completed with pt. Instructed pt on importance of showers and monitoring incisions daily. Encouraged continued IS use, walks, and sternal precautions. Pt given in-the-tube sheet, heart healthy and diabetic diets. Reviewed restrictions and exercise guidelines. Pt declining need for home, and state he is going to stay with his mother. Will refer to [facility] [place].   [id number] Reynold Bowen, RN BSN 04/19/2018 11:28 AM

## 2018-04-20 MED ORDER — ATORVASTATIN CALCIUM 80 MG PO TABS: 80.0000 mg | ORAL_TABLET | Freq: Every day | ORAL | 2 refills | Status: AC

## 2018-04-20 MED ORDER — FUROSEMIDE 40 MG PO TABS: 40.0000 mg | ORAL_TABLET | Freq: Every day | ORAL | 0 refills | Status: DC

## 2018-04-20 MED ORDER — ASPIRIN 325 MG PO TBEC: 325.0000 mg | DELAYED_RELEASE_TABLET | Freq: Every day | ORAL | 0 refills | Status: DC

## 2018-04-20 MED ORDER — POTASSIUM CHLORIDE CRYS ER 20 MEQ PO TBCR: 20.0000 meq | EXTENDED_RELEASE_TABLET | Freq: Every day | ORAL | 0 refills | Status: DC

## 2018-04-20 MED ORDER — OXYCODONE-ACETAMINOPHEN 5-325 MG PO TABS: 1.0000 | ORAL_TABLET | ORAL | 0 refills | Status: AC | PRN

## 2018-04-20 MED ORDER — METOPROLOL TARTRATE 37.5 MG PO TABS: 37.5000 mg | ORAL_TABLET | Freq: Two times a day (BID) | ORAL | 2 refills | Status: DC

## 2018-04-20 NOTE — Progress Notes (Signed)
CARDIAC REHAB PHASE I   PRE:  Rate/Rhythm: Sinus Rhythm 76  BP:    Sitting: 117/72     SaO2: 93%  MODE:  Ambulation: 450 ft   POST:  Rate/Rhythem: Sinus 78  BP:    Sitting: 117/68     SaO2: 94%  1110-1134Patient walked independently in the hallway without difficulty using a rolling walker. Patient assisted back to recliner with call light within reach.  Thayer Headings RN BSN

## 2018-04-20 NOTE — Progress Notes (Addendum)
IV and telemetry discontinued. CCMD notified.   1450: Discharge instructions reviewed with patient. All questions answered. Written prescriptions given to patient.

## 2018-04-20 NOTE — Progress Notes (Addendum)
5 Days Post-Op Procedure(s) (LRB): CORONARY ARTERY BYPASS GRAFTING (CABG), ON PUMP, TIMES THREE, USING LEFT INTERNAL MAMMARY ARTERY  AND ENDOSCOPICALLY HARVESTED LEFT GREATER SAPHENOUS VEIN (N/A) TRANSESOPHAGEAL ECHOCARDIOGRAM (TEE) (N/A) Subjective: No new problems reported.  He feels well.  He is ambulating independently.  Remains on room air with excellent oxygen saturation.  He agrees with the plan for discharge today.  Objective: Vital signs in last 24 hours: Temp:  [97.8 F (36.6 C)-98.3 F (36.8 C)] 98.2 F (36.8 C) (03/07 0915) Pulse Rate:  [78-84] 80 (03/07 0325) Cardiac Rhythm: Normal sinus rhythm (03/07 0724) Resp:  [12-26] 19 (03/07 0915) BP: (94-139)/(54-85) 139/85 (03/07 0915) SpO2:  [92 %-97 %] 92 % (03/07 0325) Weight:  [90.1 kg] 90.1 kg (03/07 0322)     Intake/Output from previous day: 03/06 0701 - 03/07 0700 In: 480 [P.O.:480] Out: 350 [Urine:350] Intake/Output this shift: Total I/O In: 240 [P.O.:240] Out: -   General appearance: alert, appears stated age and no distress Heart: regular rate and rhythm Lungs: clear to auscultation bilaterally Wound: The sterntomy and left LE incisions are well approximated and healing with no sign of complication.  Lab Results: Recent Labs    04/18/18 0723  WBC 7.8  HGB 9.4*  HCT 27.8*  PLT 126*   BMET:  Recent Labs    04/18/18 0723  NA 134*  K 4.1  CL 96*  CO2 29  GLUCOSE 120*  BUN 19  CREATININE 0.86  CALCIUM 8.5*    PT/INR: No results for input(s): LABPROT, INR in the last 72 hours. ABG    Component Value Date/Time   PHART 7.333 (L) 04/15/2018 1847   HCO3 24.9 04/15/2018 1847   TCO2 26 04/15/2018 1847   ACIDBASEDEF 1.0 04/15/2018 1847   O2SAT 97.0 04/15/2018 1847   CBG (last 3)  Recent Labs    04/18/18 0510 04/18/18 0605 04/18/18 1100  GLUCAP 114* 102* 146*    Assessment/Plan: S/P Procedure(s) (LRB): CORONARY ARTERY BYPASS GRAFTING (CABG), ON PUMP, TIMES THREE, USING LEFT INTERNAL  MAMMARY ARTERY  AND ENDOSCOPICALLY HARVESTED LEFT GREATER SAPHENOUS VEIN (N/A) TRANSESOPHAGEAL ECHOCARDIOGRAM (TEE) (N/A)  -Postop day 5 CABG x3 for multivessel CAD discovered during pre-op clearance for elective back surgery.  He remains hemodynamically stable.  No significant arrhythmias.  Plan discharge later today   -Expected mild postoperative respiratory insufficiency.  Encourage continued pulmonary hygiene.  Continue daily Lasix for 5 days   -Expected acute blood loss anemia. Mild with no indication for transfusion. Hct stable. Monitor.  -Dyslipidemia-statin has been resumed  -Diet controlled Diabetes mellitus- Glucose controlled, continue SSI. Pre-op A1C 6.3.  -DVT prophylaxis-continue enoxaparin   LOS: 9 days    Leary Roca, PA-C 04/20/2018  I have seen and examined the patient and agree with the assessment and plan as outlined.  D/C home  Purcell Nails, MD 04/20/2018 12:57 PM

## 2018-04-20 NOTE — Progress Notes (Signed)
Pt ambulated this AM in hall independently approx 400 ft. Tolerated well.

## 2018-04-23 ENCOUNTER — Telehealth: Payer: Self-pay

## 2018-04-23 NOTE — Telephone Encounter (Signed)
-----   Message from Eddie Nelson sent at 04/19/2018 10:00 AM EST ----- Regarding: TOC call Pt is post op, and will dc in a couple of days. He already has an appointment. Will just need the TOC call.  Thanks [employer]

## 2018-04-23 NOTE — Telephone Encounter (Signed)
   Patient contacted regarding discharge from [facility] on 04-20-2018.  Patient understands to follow up with provider Dr Tomie China on 04-26-2018 at 8am in [place] office. Patient understands discharge instructions?Yes Patient understands medications and regiment? Yes Patient understands to bring all medications to this visit? Yes   Postop Surgical Patients:                What is your wound status? Any signs/ symptoms of infection (Temp, redness/ red streaks, swelling, purulent drainage, foul odor or smell)? Patient reports no signs or symptoms of infection  .             Please do not place any creams/ lotions/ or antibiotic ointment on any surgical incisions/ wounds without physician approval. .             Do you have any questions about your medications?  All medications (except pain medications) are to be filled by your Cardiologist AFTER your first post op appointment with them.  Are you taking your pain medication? Yes, patient has been taking pain medication. .             How is your pain controlled? Pain level?  Pain has not been controlled the past two days, and he is having issues with sleep while trying to lay flat.  Patient instructed to try sleeping in a recliner or propping up in bed with pillows. Patient verbalized understanding.  .             If you require a refill on pain medications, know that the same medication/ amount may not be prescribed or a refill may not be given.  Please contact your pharmacy for refill requests.  .             Do you have help at home with ADL's? Patient is living with his mother at this present moment.  .             Please refer to your Pre/post surgery booklet, there is a lot of useful information in it that may answer any questions you may have. .             Please note that it is ok to remove your surgical dressing, shower (soap/ water), and pat the incision dry. Patient agreed to plan and verbalized understanding to all instructions.  For  surgery related questions staff will route a phone note to CV DIV TCTS [facility] pool  [facility] [address] [place], [place] [address]  For nonsurgery patients please delete the surgery questions.

## 2018-04-23 NOTE — Telephone Encounter (Signed)
Patient returned your call and says you can call him back when you are free

## 2018-04-26 ENCOUNTER — Ambulatory Visit (INDEPENDENT_AMBULATORY_CARE_PROVIDER_SITE_OTHER): Payer: PRIVATE HEALTH INSURANCE | Admitting: Cardiology

## 2018-04-26 ENCOUNTER — Other Ambulatory Visit: Payer: Self-pay

## 2018-04-26 ENCOUNTER — Encounter: Payer: Self-pay | Admitting: Cardiology

## 2018-04-26 VITALS — BP 118/62 | HR 74 | Ht 69.0 in | Wt 194.0 lb

## 2018-04-26 DIAGNOSIS — E119 Type 2 diabetes mellitus without complications: Secondary | ICD-10-CM

## 2018-04-26 DIAGNOSIS — I2510 Atherosclerotic heart disease of native coronary artery without angina pectoris: Secondary | ICD-10-CM | POA: Diagnosis not present

## 2018-04-26 DIAGNOSIS — E782 Mixed hyperlipidemia: Secondary | ICD-10-CM

## 2018-04-26 DIAGNOSIS — Z951 Presence of aortocoronary bypass graft: Secondary | ICD-10-CM

## 2018-04-26 DIAGNOSIS — I10 Essential (primary) hypertension: Secondary | ICD-10-CM

## 2018-04-26 NOTE — Patient Instructions (Signed)
Medication Instructions:  Your physician recommends that you continue on your current medications as directed. Please refer to the Current Medication list given to you today.  If you need a refill on your cardiac medications before your next appointment, please call your pharmacy.   Lab work: None  If you have labs (blood work) drawn today and your tests are completely normal, you will receive your results only by: . MyChart Message (if you have MyChart) OR . A paper copy in the mail If you have any lab test that is abnormal or we need to change your treatment, we will call you to review the results.  Testing/Procedures: None  Follow-Up: At [facility], you and your health needs are our priority.  As part of our continuing mission to provide you with exceptional heart care, we have created designated Provider Care Teams.  These Care Teams include your primary Cardiologist (physician) and Advanced Practice Providers (APPs -  Physician Assistants and Nurse Practitioners) who all work together to provide you with the care you need, when you need it. You will need a follow up appointment in 1 months.  Please call our office 2 months in advance to schedule this appointment.  You may see Rajan R Revankar, MD or another member of our [facility] Provider Team in [place]: Robert Krasowski, MD . Brian Munley, MD  Any Other Special Instructions Will Be Listed Below (If Applicable).    

## 2018-04-26 NOTE — Progress Notes (Signed)
Cardiology Office Note:    Date:  04/26/2018   ID:  Eddie Nelson, DOB [birth year], MRN [id number]  PCP:  Simone Curia, MD  Cardiologist:  Garwin Brothers, MD   Referring MD: Simone Curia, MD    ASSESSMENT:    1. Coronary artery disease involving native coronary artery of native heart without angina pectoris   2. Essential hypertension   3. Diet-controlled diabetes mellitus (HCC)   4. Mixed dyslipidemia   5. S/P CABG x 3    PLAN:    In order of problems listed above:  1. Secondary prevention stressed with the patient.  Importance of compliance with diet and medication stressed and he vocalized understanding.  His blood pressure is stable. 2. Diet was discussed for dyslipidemia.  Importance of regular exercise stressed.  I told him to get in touch with cardiac rehab and detail him about it.  He has a follow-up appointment with his surgeons in the next couple of weeks 3. The surgical and graft harvest sites look fine clinically. 4. He will be seen in follow-up appointment in a month or earlier if he has any concerns at that time he will have fasting blood work including lipids.   Medication Adjustments/Labs and Tests Ordered: Current medicines are reviewed at length with the patient today.  Concerns regarding medicines are outlined above.  No orders of the defined types were placed in this encounter.  No orders of the defined types were placed in this encounter.    No chief complaint on file.    History of Present Illness:    Eddie Nelson is a 63 y.o. male.  Patient was evaluated by me for preop assessment, had an abnormal stress test and went for coronary angiography which revealed multivessel disease and he had CABG surgery.  Details are mentioned below.  He denies any problems at this time and takes care of activities of daily living.  No chest pain orthopnea or PND.  He is very happy that these issues were handled in the prompt an expedited manner and he feels  much better at this time.  At the time of my evaluation, the patient is alert awake oriented and in no distress.  Past Medical History:  Diagnosis Date  . Hypertension   . Osteoarthritis     Past Surgical History:  Procedure Laterality Date  . CORONARY ARTERY BYPASS GRAFT N/A 04/15/2018   Procedure: CORONARY ARTERY BYPASS GRAFTING (CABG), ON PUMP, TIMES THREE, USING LEFT INTERNAL MAMMARY ARTERY  AND ENDOSCOPICALLY HARVESTED LEFT GREATER SAPHENOUS VEIN;  Surgeon: Kerin Perna, MD;  Location: [facility] OR;  Service: Open Heart Surgery;  Laterality: N/A;  . LEFT HEART CATH AND CORONARY ANGIOGRAPHY N/A 04/11/2018   Procedure: LEFT HEART CATH AND CORONARY ANGIOGRAPHY;  Surgeon: Lennette Bihari, MD;  Location: [facility] INVASIVE CV LAB;  Service: Cardiovascular;  Laterality: N/A;  . TEE WITHOUT CARDIOVERSION N/A 04/15/2018   Procedure: TRANSESOPHAGEAL ECHOCARDIOGRAM (TEE);  Surgeon: Donata Clay, Theron Arista, MD;  Location: [facility] OR;  Service: Open Heart Surgery;  Laterality: N/A;    Current Medications: Current Meds  Medication Sig  . aspirin EC 325 MG EC tablet Take 1 tablet (325 mg total) by mouth daily.  Marland Kitchen atorvastatin (LIPITOR) 80 MG tablet Take 1 tablet (80 mg total) by mouth daily at 6 PM.  . cyclobenzaprine (FLEXERIL) 10 MG tablet Take 1 tablet by mouth 3 (three) times daily as needed for muscle spasms.   Marland Kitchen HYDROcodone-acetaminophen (NORCO/VICODIN) 5-325 MG tablet Take 1  tablet by mouth every 6 (six) hours as needed (pain).   Marland Kitchen lisinopril (PRINIVIL,ZESTRIL) 10 MG tablet Take 1 tablet by mouth daily.  . Metoprolol Tartrate 37.5 MG TABS Take 37.5 mg by mouth 2 (two) times daily.  . traMADol (ULTRAM) 50 MG tablet Take 50 mg by mouth every 6 (six) hours as needed for moderate pain.      Allergies:   Patient has no known allergies.   Social History   Socioeconomic History  . Marital status: Single    Spouse name: Not on file  . Number of children: Not on file  . Years of education: Not on file  . Highest  education level: Not on file  Occupational History  . Not on file  Social Needs  . Financial resource strain: Not on file  . Food insecurity:    Worry: Not on file    Inability: Not on file  . Transportation needs:    Medical: Not on file    Non-medical: Not on file  Tobacco Use  . Smoking status: Former Games developer  . Smokeless tobacco: Never Used  Substance and Sexual Activity  . Alcohol use: Not on file  . Drug use: Not on file  . Sexual activity: Not on file  Lifestyle  . Physical activity:    Days per week: Not on file    Minutes per session: Not on file  . Stress: Not on file  Relationships  . Social connections:    Talks on phone: Not on file    Gets together: Not on file    Attends religious service: Not on file    Active member of club or organization: Not on file    Attends meetings of clubs or organizations: Not on file    Relationship status: Not on file  Other Topics Concern  . Not on file  Social History Narrative  . Not on file     Family History: The patient's family history includes Heart disease in his father; Hodgkin's lymphoma in his sister; Hyperlipidemia in his brother.  ROS:   Please see the history of present illness.    All other systems reviewed and are negative.  EKGs/Labs/Other Studies Reviewed:    The following studies were reviewed today:Procedures   LEFT HEART CATH AND CORONARY ANGIOGRAPHY  Conclusion     Prox RCA lesion is 80% stenosed.  Mid RCA lesion is 95% stenosed.  Ost RPDA to RPDA lesion is 99% stenosed.  Prox Cx lesion is 50% stenosed.  Ost 1st Mrg lesion is 90% stenosed.  Prox LAD to Mid LAD lesion is 95% stenosed.  The left ventricular ejection fraction is 50-55% by visual estimate.   Severe multivessel coronary obstructive disease with subtotal stenosis of the mid LAD between the second and third diagonal vessels; 50 and 95% stenosis of the proximal circumflex extending into a large OM1 branch which supplies the  apex; and large dominant RCA with 80% proximal stenosis, 95% mid stenosis, patent and filling of the distal RCA with possible subtotal occlusion of the proximal PDA with both right to PDA and LAD to PDA retrograde collaterals.  Normal LV function with an EF of 50%.  There is focal mild mid inferior hypocontractility and a suggestion of possible mild focal anterolateral hypocontractility.  LVEDP is 17 mm.  RECOMMENDATION: Surgical consultation will be obtained for CABG revascularization surgery.  Patient has severe multivessel CAD with excellent targets for bypass surgery.  We will keep him overnight so that this process  can be initiated and will initiate anti-ischemic medical therapy.  The patient will initially be treated with aspirin.  High potency statin therapy with target LDL less than 70.          Recent Labs: 04/13/2018: ALT 27; TSH 3.422 04/16/2018: Magnesium 2.2 04/18/2018: BUN 19; Creatinine, Ser 0.86; Hemoglobin 9.4; Platelets 126; Potassium 4.1; Sodium 134  Recent Lipid Panel    Component Value Date/Time   CHOL 301 (H) 04/10/2018 1223   TRIG 114 04/10/2018 1223   HDL 48 04/10/2018 1223   CHOLHDL 6.3 (H) 04/10/2018 1223   LDLCALC 230 (H) 04/10/2018 1223    Physical Exam:    VS:  BP 118/62 (BP Location: Right Arm, Patient Position: Sitting, Cuff Size: Normal)   Pulse 74   Ht 5\' 9"  (1.753 m)   Wt 194 lb (88 kg)   SpO2 98%   BMI 28.65 kg/m     Wt Readings from Last 3 Encounters:  04/26/18 194 lb (88 kg)  04/20/18 198 lb 9.6 oz (90.1 kg)  04/10/18 201 lb (91.2 kg)     GEN: Patient is in no acute distress HEENT: Normal NECK: No JVD; No carotid bruits LYMPHATICS: No lymphadenopathy CARDIAC: Hear sounds regular, 2/6 systolic murmur at the apex. RESPIRATORY:  Clear to auscultation without rales, wheezing or rhonchi  ABDOMEN: Soft, non-tender, non-distended MUSCULOSKELETAL:  No edema; No deformity  SKIN: Warm and dry NEUROLOGIC:  Alert and oriented x 3  PSYCHIATRIC:  Normal affect   Signed, Garwin Brothers, MD  04/26/2018 8:33 AM    [facility]

## 2018-05-01 ENCOUNTER — Other Ambulatory Visit (HOSPITAL_COMMUNITY): Payer: Self-pay | Admitting: Physician Assistant

## 2018-05-01 MED ORDER — OXYCODONE-ACETAMINOPHEN 5-325 MG PO TABS: 1.0000 | ORAL_TABLET | Freq: Four times a day (QID) | ORAL | 0 refills | Status: DC | PRN

## 2018-05-01 NOTE — Progress Notes (Signed)
      [address]       [place] [address]             [phone]     Prescription filled today for Percocet 5/325 q 6 hours PRN Disp #15. Last prescription for Percocet was given at the time of discharge. Discussed with Jillyn Hidden PA-C. All other narcotics discontinued since they were from a pervious surgery and the patient is no longer taking.   Follow-up in our clinic planned for 4/1 with Dr. Donata Clay.   Jari Favre, PA-C

## 2018-05-06 ENCOUNTER — Telehealth: Payer: Self-pay

## 2018-05-06 NOTE — Telephone Encounter (Signed)
Patient contacted the office with questions about multiple supplements that he was taking prior to surgery.  Patient was advised that he is to not restart any medications that were not listed on his medication list at discharge from the hospital.  Patient is s/p CABG 04/15/2018.  Patient advised to speak with his Cardiologist, Dr. Tomie China in regards to over the counter medications and supplements.  He acknowledged receipt.

## 2018-05-06 NOTE — Telephone Encounter (Signed)
This encounter was created in error - please disregard.

## 2018-05-15 ENCOUNTER — Encounter: Payer: Self-pay | Admitting: Cardiothoracic Surgery

## 2018-05-15 ENCOUNTER — Ambulatory Visit
Admission: RE | Admit: 2018-05-15 | Discharge: 2018-05-15 | Disposition: A | Discharge status: Home / Self Care | Payer: PRIVATE HEALTH INSURANCE | Source: Ambulatory Visit | Attending: Cardiothoracic Surgery | Admitting: Cardiothoracic Surgery

## 2018-05-15 ENCOUNTER — Other Ambulatory Visit: Payer: Self-pay

## 2018-05-15 ENCOUNTER — Ambulatory Visit (INDEPENDENT_AMBULATORY_CARE_PROVIDER_SITE_OTHER): Payer: Self-pay | Admitting: Cardiothoracic Surgery

## 2018-05-15 ENCOUNTER — Other Ambulatory Visit: Payer: Self-pay | Admitting: *Deleted

## 2018-05-15 VITALS — BP 111/68 | HR 67 | Temp 97.6°F | Resp 20 | Ht 69.0 in | Wt 194.0 lb

## 2018-05-15 DIAGNOSIS — I2510 Atherosclerotic heart disease of native coronary artery without angina pectoris: Secondary | ICD-10-CM

## 2018-05-15 DIAGNOSIS — Z951 Presence of aortocoronary bypass graft: Secondary | ICD-10-CM

## 2018-05-15 MED ORDER — OXYCODONE-ACETAMINOPHEN 5-325 MG PO TABS: 1.0000 | ORAL_TABLET | Freq: Four times a day (QID) | ORAL | 0 refills | Status: DC | PRN

## 2018-05-15 NOTE — Progress Notes (Unsigned)
C-

## 2018-05-15 NOTE — Progress Notes (Signed)
PCP is Simone Curia, MD Referring Provider is Revankar, Aundra Dubin, MD  Chief Complaint  Patient presents with  . Routine Post Op    f/u from surgery with CXR s/p Coronary artery bypass grafting x3, 04/15/18     HPI: Scheduled 1 month postop follow-up after CABG x3 for severe coronary disease with positive stress test.  Patient progressing well.  Incisions healed.  Chest x-ray is clear.  Remains in sinus rhythm with stable blood pressure.  Patient's main complaint is back pain from his chronic osteoarthritis for which he is scheduled for spine surgery later this year.  He also has surgical sternal musculoskeletal pain.   Past Medical History:  Diagnosis Date  . Hypertension   . Osteoarthritis     Past Surgical History:  Procedure Laterality Date  . CORONARY ARTERY BYPASS GRAFT N/A 04/15/2018   Procedure: CORONARY ARTERY BYPASS GRAFTING (CABG), ON PUMP, TIMES THREE, USING LEFT INTERNAL MAMMARY ARTERY  AND ENDOSCOPICALLY HARVESTED LEFT GREATER SAPHENOUS VEIN;  Surgeon: Kerin Perna, MD;  Location: [facility] OR;  Service: Open Heart Surgery;  Laterality: N/A;  . LEFT HEART CATH AND CORONARY ANGIOGRAPHY N/A 04/11/2018   Procedure: LEFT HEART CATH AND CORONARY ANGIOGRAPHY;  Surgeon: Lennette Bihari, MD;  [facility]: [facility];  Service: Cardiovascular;  Laterality: N/A;  . TEE WITHOUT CARDIOVERSION N/A 04/15/2018   Procedure: TRANSESOPHAGEAL ECHOCARDIOGRAM (TEE);  Surgeon: Donata Clay, Theron Arista, MD;  Location: [facility] OR;  Service: Open Heart Surgery;  Laterality: N/A;    Family History  Problem Relation Age of Onset  . Heart disease Father   . Hodgkin's lymphoma Sister   . Hyperlipidemia Brother     Social History Social History   Tobacco Use  . Smoking status: Former Games developer  . Smokeless tobacco: Never Used  Substance Use Topics  . Alcohol use: Not on file  . Drug use: Not on file    Current Outpatient Medications  Medication Sig Dispense Refill  . aspirin EC 325 MG EC tablet Take 1  tablet (325 mg total) by mouth daily. 30 tablet 0  . atorvastatin (LIPITOR) 80 MG tablet Take 1 tablet (80 mg total) by mouth daily at 6 PM. 30 tablet 2  . cyclobenzaprine (FLEXERIL) 10 MG tablet Take 1 tablet by mouth 3 (three) times daily as needed for muscle spasms.     Marland Kitchen lisinopril (PRINIVIL,ZESTRIL) 10 MG tablet Take 1 tablet by mouth daily.    . Metoprolol Tartrate 37.5 MG TABS Take 37.5 mg by mouth 2 (two) times daily. 60 tablet 2  . oxyCODONE-acetaminophen (PERCOCET) 5-325 MG tablet Take 1 tablet by mouth every 6 (six) hours as needed for severe pain. 28 tablet 0   No current facility-administered medications for this visit.     No Known Allergies  Review of Systems   Strength and appetite improving Sleeping pattern improving No ankle edema Some postop left hand tingling and numbness in fifth finger has resolved.  BP 111/68   Pulse 67   Temp 97.6 F (36.4 C) (Oral)   Resp 20   Ht 5\' 9"  (1.753 m)   Wt 194 lb (88 kg)   SpO2 95% Comment: RA  BMI 28.65 kg/m  Physical Exam Alert and comfortable Lungs clear Heart rhythm regular without murmur Sternal incision well-healed Leg incision well-healed on left leg No peripheral edema  Diagnostic Tests: Chest x-ray images personally reviewed revealing clear lung fields, no pleural effusions, sternal wires intact and cardiac silhouette stable  Impression: Good early recovery  after urgent CABG x3 Continue current medications Activity restrictions have been lifted so that patient may drive, lift up to 15 pounds, and he is encouraged to take a 15 to 20-minute walk daily.  Plan: Return in 6 weeks for final review of progress and to assess patient's ability to undergo spinal reconstructive surgery.   Mikey Bussing, MD [facility] [phone]

## 2018-05-21 ENCOUNTER — Telehealth: Payer: Self-pay

## 2018-05-21 NOTE — Telephone Encounter (Signed)
Called to talk to patient about scheduling a virtual visit patient didn't answer left message to call back

## 2018-05-22 ENCOUNTER — Telehealth: Payer: Self-pay

## 2018-05-22 NOTE — Telephone Encounter (Signed)
Left message for patient to call back concerning 4/21 appt

## 2018-05-24 ENCOUNTER — Telehealth: Payer: Self-pay

## 2018-05-24 NOTE — Telephone Encounter (Signed)
Patient states he dropped FMLA paper work off on 04/27/18 after seeing Dr.RRR on 04/26/18. He initially paid for [employer] to fill information out based off visit now patient wants Dr.Durrani's office to fill information out. RN skyped with H.Bowman and was advised that paperwork is ready to send back to [employer] HR dept.

## 2018-05-24 NOTE — Telephone Encounter (Signed)
Pt needs paperwork filled out and Dr.K has reviewed but patient will need appt to see Dr.RRR. RN called to add patient to today schedule at 4 pm for a virtual visit. Left message on pt cell to call back.

## 2018-06-04 ENCOUNTER — Ambulatory Visit: Payer: PRIVATE HEALTH INSURANCE | Admitting: Cardiology

## 2018-06-25 ENCOUNTER — Other Ambulatory Visit: Payer: Self-pay

## 2018-06-25 ENCOUNTER — Other Ambulatory Visit: Payer: Self-pay | Admitting: Cardiothoracic Surgery

## 2018-06-25 DIAGNOSIS — Z951 Presence of aortocoronary bypass graft: Secondary | ICD-10-CM

## 2018-06-26 ENCOUNTER — Encounter: Payer: Self-pay | Admitting: Cardiothoracic Surgery

## 2018-06-26 ENCOUNTER — Ambulatory Visit (INDEPENDENT_AMBULATORY_CARE_PROVIDER_SITE_OTHER): Payer: Self-pay | Admitting: Cardiothoracic Surgery

## 2018-06-26 ENCOUNTER — Ambulatory Visit
Admission: RE | Admit: 2018-06-26 | Discharge: 2018-06-26 | Disposition: A | Discharge status: Home / Self Care | Payer: PRIVATE HEALTH INSURANCE | Source: Ambulatory Visit | Attending: Cardiothoracic Surgery | Admitting: Cardiothoracic Surgery

## 2018-06-26 VITALS — BP 110/66 | HR 56 | Temp 97.9°F | Resp 20 | Ht 69.0 in | Wt 205.0 lb

## 2018-06-26 DIAGNOSIS — Z951 Presence of aortocoronary bypass graft: Secondary | ICD-10-CM

## 2018-06-26 DIAGNOSIS — I2510 Atherosclerotic heart disease of native coronary artery without angina pectoris: Secondary | ICD-10-CM

## 2018-06-26 MED ORDER — METOPROLOL SUCCINATE ER 25 MG PO TB24: 25.0000 mg | ORAL_TABLET | Freq: Every day | ORAL | 0 refills | Status: DC

## 2018-06-26 NOTE — Progress Notes (Signed)
PCP is Simone CuriaLee, Keung, MD Referring Provider is Revankar, Aundra Dubinajan R, MD  Chief Complaint  Patient presents with  . Routine Post Op    6 week f/u with CXR HX of CABG x 3...04/15/18    HPI: The patient returns for scheduled 611-month follow-up after urgent CABG x3.  Patient is doing well without cardiac symptoms.  He has severe chronic low back pain and needs a spinal fusion.  His chest x-ray today is clear.  He is maintaining sinus rhythm.  Surgical incisions are well-healed.  His symptoms of angina have resolved. He will be ready to have elective back surgery-spinal fusion in mid June.  He is currently back working.  He is walking and feels much stronger.  His heart rate at rest is decreased 58/min and his beta-blocker will be reduced to Toprol-XL 25 mg daily.   Past Medical History:  Diagnosis Date  . Hypertension   . Osteoarthritis     Past Surgical History:  Procedure Laterality Date  . CORONARY ARTERY BYPASS GRAFT N/A 04/15/2018   Procedure: CORONARY ARTERY BYPASS GRAFTING (CABG), ON PUMP, TIMES THREE, USING LEFT INTERNAL MAMMARY ARTERY  AND ENDOSCOPICALLY HARVESTED LEFT GREATER SAPHENOUS VEIN;  Surgeon: Kerin PernaVan Trigt, Peter, MD;  [facility];  Service: Open Heart Surgery;  Laterality: N/A;  . LEFT HEART CATH AND CORONARY ANGIOGRAPHY N/A 04/11/2018   Procedure: LEFT HEART CATH AND CORONARY ANGIOGRAPHY;  Surgeon: Lennette BihariKelly, Thomas A, MD;  Location: [facility];  Service: Cardiovascular;  Laterality: N/A;  . TEE WITHOUT CARDIOVERSION N/A 04/15/2018   Procedure: TRANSESOPHAGEAL ECHOCARDIOGRAM (TEE);  Surgeon: Donata ClayVan Trigt, Theron AristaPeter, MD;  [facility];  Service: Open Heart Surgery;  Laterality: N/A;    Family History  Problem Relation Age of Onset  . Heart disease Father   . Hodgkin's lymphoma Sister   . Hyperlipidemia Brother     Social History Social History   Tobacco Use  . Smoking status: Former Games developermoker  . Smokeless tobacco: Never Used  Substance Use Topics  . Alcohol use: Not on  file  . Drug use: Not on file    Current Outpatient Medications  Medication Sig Dispense Refill  . aspirin EC 325 MG EC tablet Take 1 tablet (325 mg total) by mouth daily. 30 tablet 0  . atorvastatin (LIPITOR) 80 MG tablet Take 1 tablet (80 mg total) by mouth daily at 6 PM. 30 tablet 2  . lisinopril (PRINIVIL,ZESTRIL) 10 MG tablet Take 1 tablet by mouth daily.    . metoprolol succinate (TOPROL XL) 25 MG 24 hr tablet Take 1 tablet (25 mg total) by mouth daily for 30 days. 30 tablet 0   No current facility-administered medications for this visit.     No Known Allergies  Review of Systems  Appetite and strength improved No shortness of breath No cough fever Mild soreness in the chest after activities and stretching No instability of sternal incision, no sternal click or pop sensation  BP 110/66   Pulse (!) 56   Temp 97.9 F (36.6 C) (Skin)   Resp 20   Ht 5\' 9"  (1.753 m)   Wt 205 lb (93 kg)   SpO2 95% Comment: RA  BMI 30.27 kg/m  Physical Exam      Exam    General- alert and comfortable    Neck- no JVD, no cervical adenopathy palpable, no carotid bruit   Lungs- clear without rales, wheezes.  Sternal incision well-healed.   Cor- regular rate and rhythm, no murmur ,  gallop   Abdomen- soft, non-tender   Extremities - warm, non-tender, minimal edema.  Leg incision well-healed.   Neuro- oriented, appropriate, no focal weakness   Diagnostic Tests: Chest x-ray image performed today personally reviewed showing clear lung fields, no pleural effusion, sternal wires intact.  Impression: Excellent early recovery after multivessel CABG. Patient has severely symptomatic low back pain and is waiting for spine surgery at [facility].  He is cleared from his cardiac status to proceed with surgery in mid June 2020. Plan: He will follow-up with Dr. Tomie China and return here as needed. He understands importance of long-term heart healthy lifestyle including 30 minutes of aerobic  activity 5 days a week and heart healthy diet.  Mikey Bussing, MD [facility] [phone]

## 2018-07-31 DIAGNOSIS — Z01818 Encounter for other preprocedural examination: Secondary | ICD-10-CM | POA: Diagnosis not present

## 2019-10-09 ENCOUNTER — Other Ambulatory Visit: Payer: Self-pay

## 2019-10-09 ENCOUNTER — Ambulatory Visit: Payer: PRIVATE HEALTH INSURANCE | Admitting: Cardiology

## 2019-10-09 ENCOUNTER — Encounter: Payer: Self-pay | Admitting: Cardiology

## 2019-10-09 VITALS — BP 118/72 | HR 60 | Ht 68.75 in | Wt 224.2 lb

## 2019-10-09 DIAGNOSIS — I2510 Atherosclerotic heart disease of native coronary artery without angina pectoris: Secondary | ICD-10-CM

## 2019-10-09 DIAGNOSIS — E782 Mixed hyperlipidemia: Secondary | ICD-10-CM

## 2019-10-09 DIAGNOSIS — I10 Essential (primary) hypertension: Secondary | ICD-10-CM | POA: Diagnosis not present

## 2019-10-09 DIAGNOSIS — Z951 Presence of aortocoronary bypass graft: Secondary | ICD-10-CM

## 2019-10-09 DIAGNOSIS — E119 Type 2 diabetes mellitus without complications: Secondary | ICD-10-CM | POA: Diagnosis not present

## 2019-10-09 NOTE — Patient Instructions (Signed)

## 2019-10-09 NOTE — Progress Notes (Signed)
Cardiology Office Note:    Date:  10/09/2019   ID:  Ashely Joshua, DOB 1955-08-14, MRN [id number]  PCP:  Simone Curia, MD  Cardiologist:  Garwin Brothers, MD   Referring MD: Simone Curia, MD    ASSESSMENT:    1. Coronary artery disease involving native coronary artery of native heart without angina pectoris   2. Essential hypertension   3. Diet-controlled diabetes mellitus (HCC)   4. Mixed dyslipidemia   5. S/P CABG x 3    PLAN:    In order of problems listed above:  1. Coronary artery disease: Secondary prevention stressed with the patient.  Importance of compliance with diet medication stressed he vocalized understanding.  Importance of regular exercise stressed he promises to exercise at least 30 minutes a day 5 days a week.  Weight reduction was also stressed. 2. Essential hypertension: Blood pressure is stable 3. Diet controlled diabetes mellitus: Managed by primary care physician and patient's diet was revisited and recommendations were made. 4. Mixed dyslipidemia: Managed by primary care physician.  Recent KPN sheet reveals elevated triglycerides.  He is planning to have blood work done on the ninth by his primary care and send [place] a copy.  We will review this and make modifications as necessary. 5. Patient will be seen in follow-up appointment in 6 months or earlier if the patient has any concerns    Medication Adjustments/Labs and Tests Ordered: Current medicines are reviewed at length with the patient today.  Concerns regarding medicines are outlined above.  Orders Placed This Encounter  Procedures   EKG 12-Lead   No orders of the defined types were placed in this encounter.    No chief complaint on file.    History of Present Illness:    Eddie Nelson is a 64 y.o. male.  Patient has past medical history of coronary artery disease post CABG surgery essential hypertension dyslipidemia and diabetes mellitus.  He is markedly overweight elevated  triglycerides.  He denies any problems at this time and takes care of activities of daily living.  No chest pain orthopnea or PND.  He leads a sedentary lifestyle.  At the time of my evaluation, the patient is alert awake oriented and in no distress.  Past Medical History:  Diagnosis Date   Hypertension    Osteoarthritis     Past Surgical History:  Procedure Laterality Date   CORONARY ARTERY BYPASS GRAFT N/A 04/15/2018   Procedure: CORONARY ARTERY BYPASS GRAFTING (CABG), ON PUMP, TIMES THREE, USING LEFT INTERNAL MAMMARY ARTERY  AND ENDOSCOPICALLY HARVESTED LEFT GREATER SAPHENOUS VEIN;  Surgeon: Kerin Perna, MD;  Location: [facility] OR;  Service: Open Heart Surgery;  Laterality: N/A;   LEFT HEART CATH AND CORONARY ANGIOGRAPHY N/A 04/11/2018   Procedure: LEFT HEART CATH AND CORONARY ANGIOGRAPHY;  Surgeon: Lennette Bihari, MD;  [facility]: [facility];  Service: Cardiovascular;  Laterality: N/A;   TEE WITHOUT CARDIOVERSION N/A 04/15/2018   Procedure: TRANSESOPHAGEAL ECHOCARDIOGRAM (TEE);  Surgeon: Donata Clay, Theron Arista, MD;  Location: [facility] OR;  Service: Open Heart Surgery;  Laterality: N/A;    Current Medications: Current Meds  Medication Sig   aspirin EC 325 MG EC tablet Take 1 tablet (325 mg total) by mouth daily.   atorvastatin (LIPITOR) 80 MG tablet Take 1 tablet (80 mg total) by mouth daily at 6 PM.   ezetimibe (ZETIA) 10 MG tablet Take 10 mg by mouth daily.   furosemide (LASIX) 20 MG tablet Take 20 mg by mouth  daily.   lisinopril (PRINIVIL,ZESTRIL) 10 MG tablet Take 1 tablet by mouth daily.   oxyCODONE-acetaminophen (PERCOCET/ROXICET) 5-325 MG tablet Take 1 tablet by mouth every 6 (six) hours as needed.   VASCEPA 1 g capsule Take 2 g by mouth 2 (two) times daily.     Allergies:   Patient has no known allergies.   Social History   Socioeconomic History   Marital status: Single    Spouse name: Not on file   Number of children: Not on file   Years of education: Not on file    Highest education level: Not on file  Occupational History   Not on file  Tobacco Use   Smoking status: Former Smoker   Smokeless tobacco: Never Used  Substance and Sexual Activity   Alcohol use: Not on file   Drug use: Not on file   Sexual activity: Not on file  Other Topics Concern   Not on file  Social History Narrative   Not on file   Social Determinants of Health   Financial Resource Strain:    Difficulty of Paying Living Expenses: Not on file  Food Insecurity:    Worried About Running Out of Food in the Last Year: Not on file   Ran Out of Food in the Last Year: Not on file  Transportation Needs:    Lack of Transportation (Medical): Not on file   Lack of Transportation (Non-Medical): Not on file  Physical Activity:    Days of Exercise per Week: Not on file   Minutes of Exercise per Session: Not on file  Stress:    Feeling of Stress : Not on file  Social Connections:    Frequency of Communication with Friends and Family: Not on file   Frequency of Social Gatherings with Friends and Family: Not on file   Attends Religious Services: Not on file   Active Member of Clubs or Organizations: Not on file   Attends Banker Meetings: Not on file   Marital Status: Not on file     Family History: The patient's family history includes Heart disease in his father; Hodgkin's lymphoma in his sister; Hyperlipidemia in his brother.  ROS:   Please see the history of present illness.    All other systems reviewed and are negative.  EKGs/Labs/Other Studies Reviewed:    The following studies were reviewed today: EKG reveals sinus rhythm and nonspecific ST-T changes.  This was done in the past.   Recent Labs: No results found for requested labs within last 8760 hours.  Recent Lipid Panel    Component Value Date/Time   CHOL 301 (H) 04/10/2018 1223   TRIG 114 04/10/2018 1223   HDL 48 04/10/2018 1223   CHOLHDL 6.3 (H) 04/10/2018 1223    LDLCALC 230 (H) 04/10/2018 1223    Physical Exam:    VS:  BP 118/72    Pulse 60    Ht 5' 8.75" (1.746 m)    Wt 224 lb 3.2 oz (101.7 kg)    SpO2 97%    BMI 33.35 kg/m     Wt Readings from Last 3 Encounters:  10/09/19 224 lb 3.2 oz (101.7 kg)  06/26/18 205 lb (93 kg)  05/15/18 194 lb (88 kg)     GEN: Patient is in no acute distress HEENT: Normal NECK: No JVD; No carotid bruits LYMPHATICS: No lymphadenopathy CARDIAC: Hear sounds regular, 2/6 systolic murmur at the apex. RESPIRATORY:  Clear to auscultation without rales, wheezing or rhonchi  ABDOMEN: Soft, non-tender, non-distended MUSCULOSKELETAL:  No edema; No deformity  SKIN: Warm and dry NEUROLOGIC:  Alert and oriented x 3 PSYCHIATRIC:  Normal affect   Signed, Garwin Brothers, MD  10/09/2019 1:40 PM    [facility]

## 2020-01-15 DIAGNOSIS — I2510 Atherosclerotic heart disease of native coronary artery without angina pectoris: Secondary | ICD-10-CM | POA: Diagnosis not present

## 2020-01-15 DIAGNOSIS — M159 Polyosteoarthritis, unspecified: Secondary | ICD-10-CM | POA: Diagnosis not present

## 2020-01-15 DIAGNOSIS — E7800 Pure hypercholesterolemia, unspecified: Secondary | ICD-10-CM | POA: Diagnosis not present

## 2020-01-15 DIAGNOSIS — E291 Testicular hypofunction: Secondary | ICD-10-CM | POA: Diagnosis not present

## 2020-01-15 DIAGNOSIS — R609 Edema, unspecified: Secondary | ICD-10-CM | POA: Diagnosis not present

## 2020-01-15 DIAGNOSIS — G8929 Other chronic pain: Secondary | ICD-10-CM | POA: Diagnosis not present

## 2020-01-15 DIAGNOSIS — Z6832 Body mass index (BMI) 32.0-32.9, adult: Secondary | ICD-10-CM | POA: Diagnosis not present

## 2020-01-15 DIAGNOSIS — I10 Essential (primary) hypertension: Secondary | ICD-10-CM | POA: Diagnosis not present

## 2020-01-15 DIAGNOSIS — M545 Low back pain, unspecified: Secondary | ICD-10-CM | POA: Diagnosis not present

## 2020-01-15 DIAGNOSIS — E669 Obesity, unspecified: Secondary | ICD-10-CM | POA: Diagnosis not present

## 2020-01-19 IMAGING — CR DG CHEST 2V
2 series · 2 of 2 positions shown · non-contrast
Comparison: 03/17/2015.

CLINICAL DATA: Pre CABG evaluation.  Ex-smoker.

EXAM:
CHEST - 2 VIEW

[chest lat]
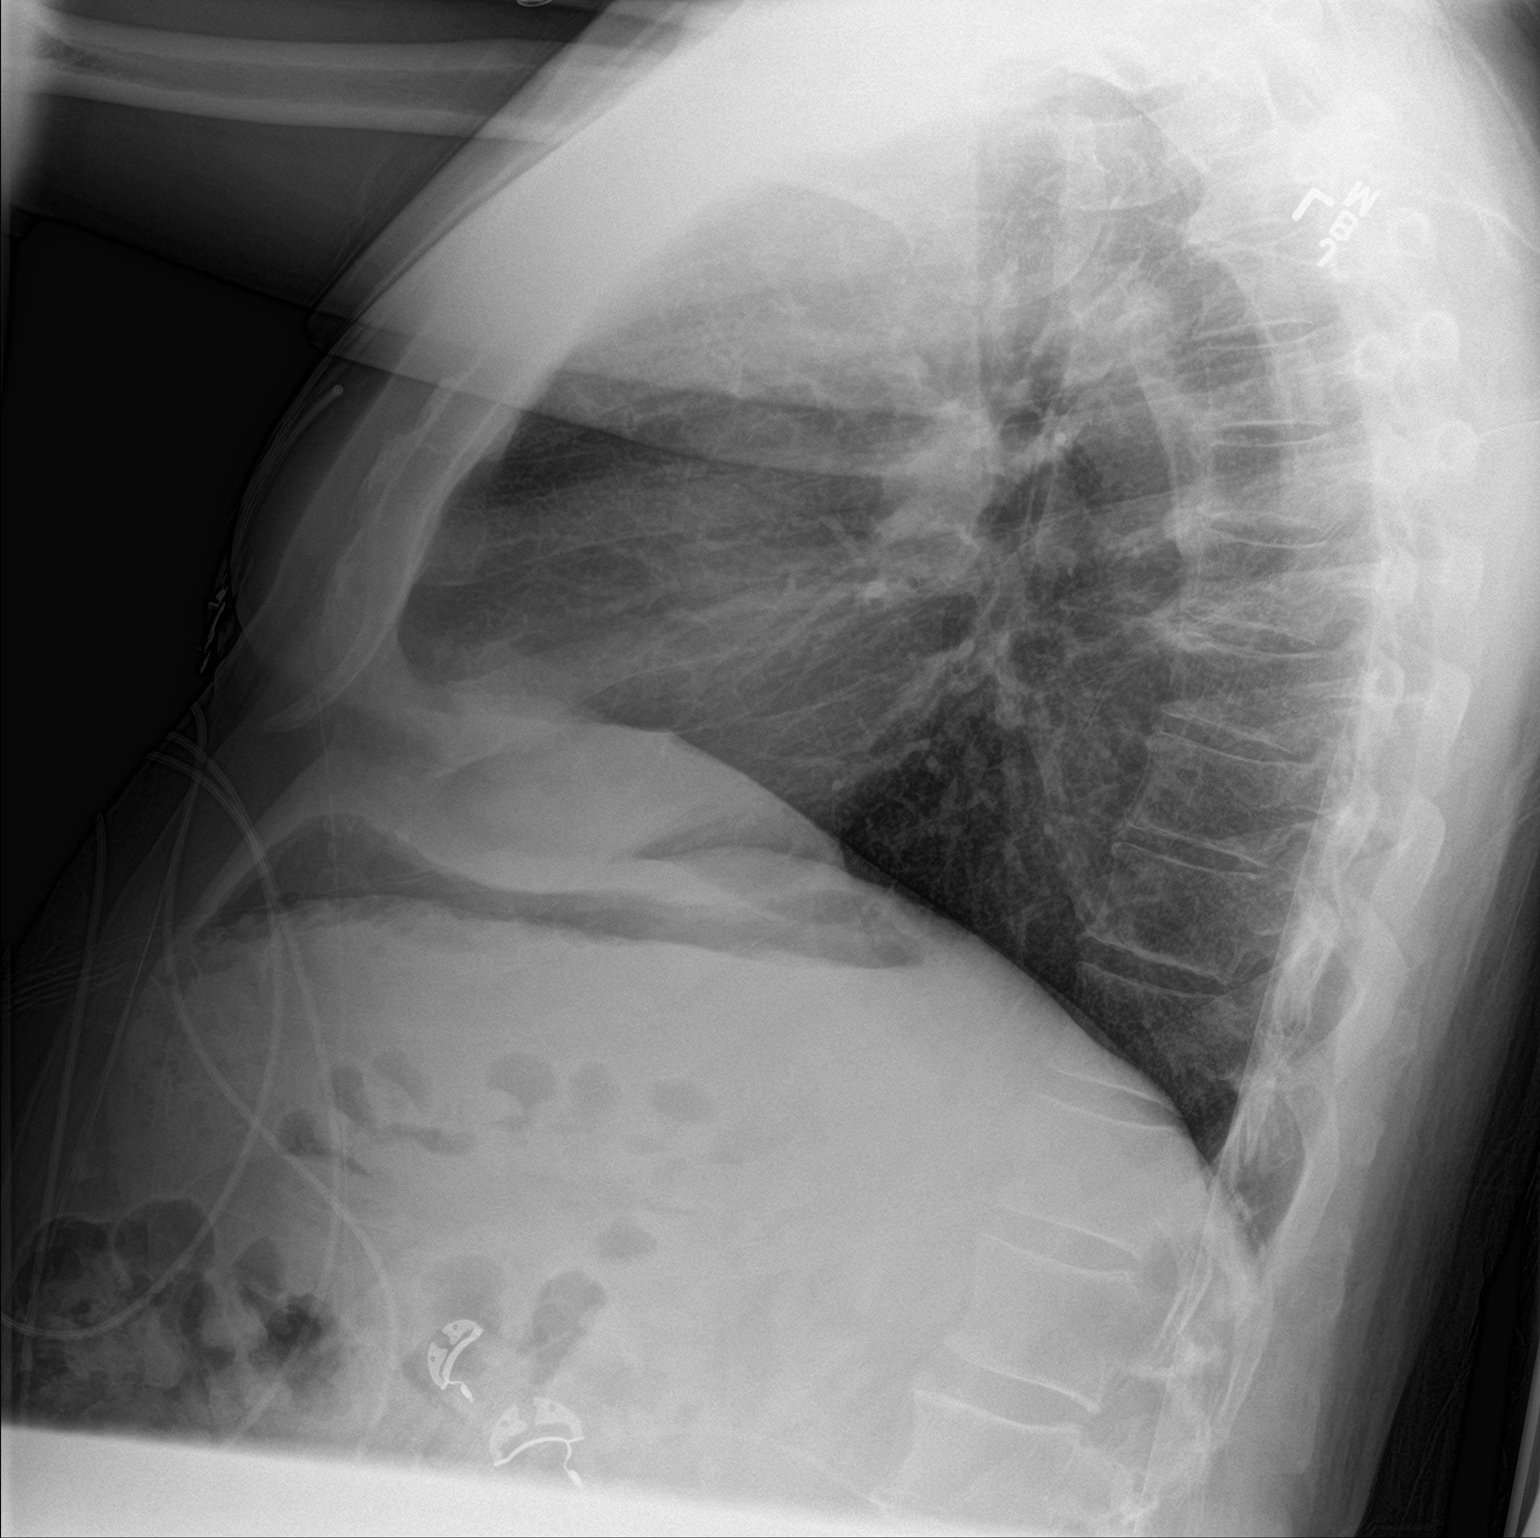

[chest ap]
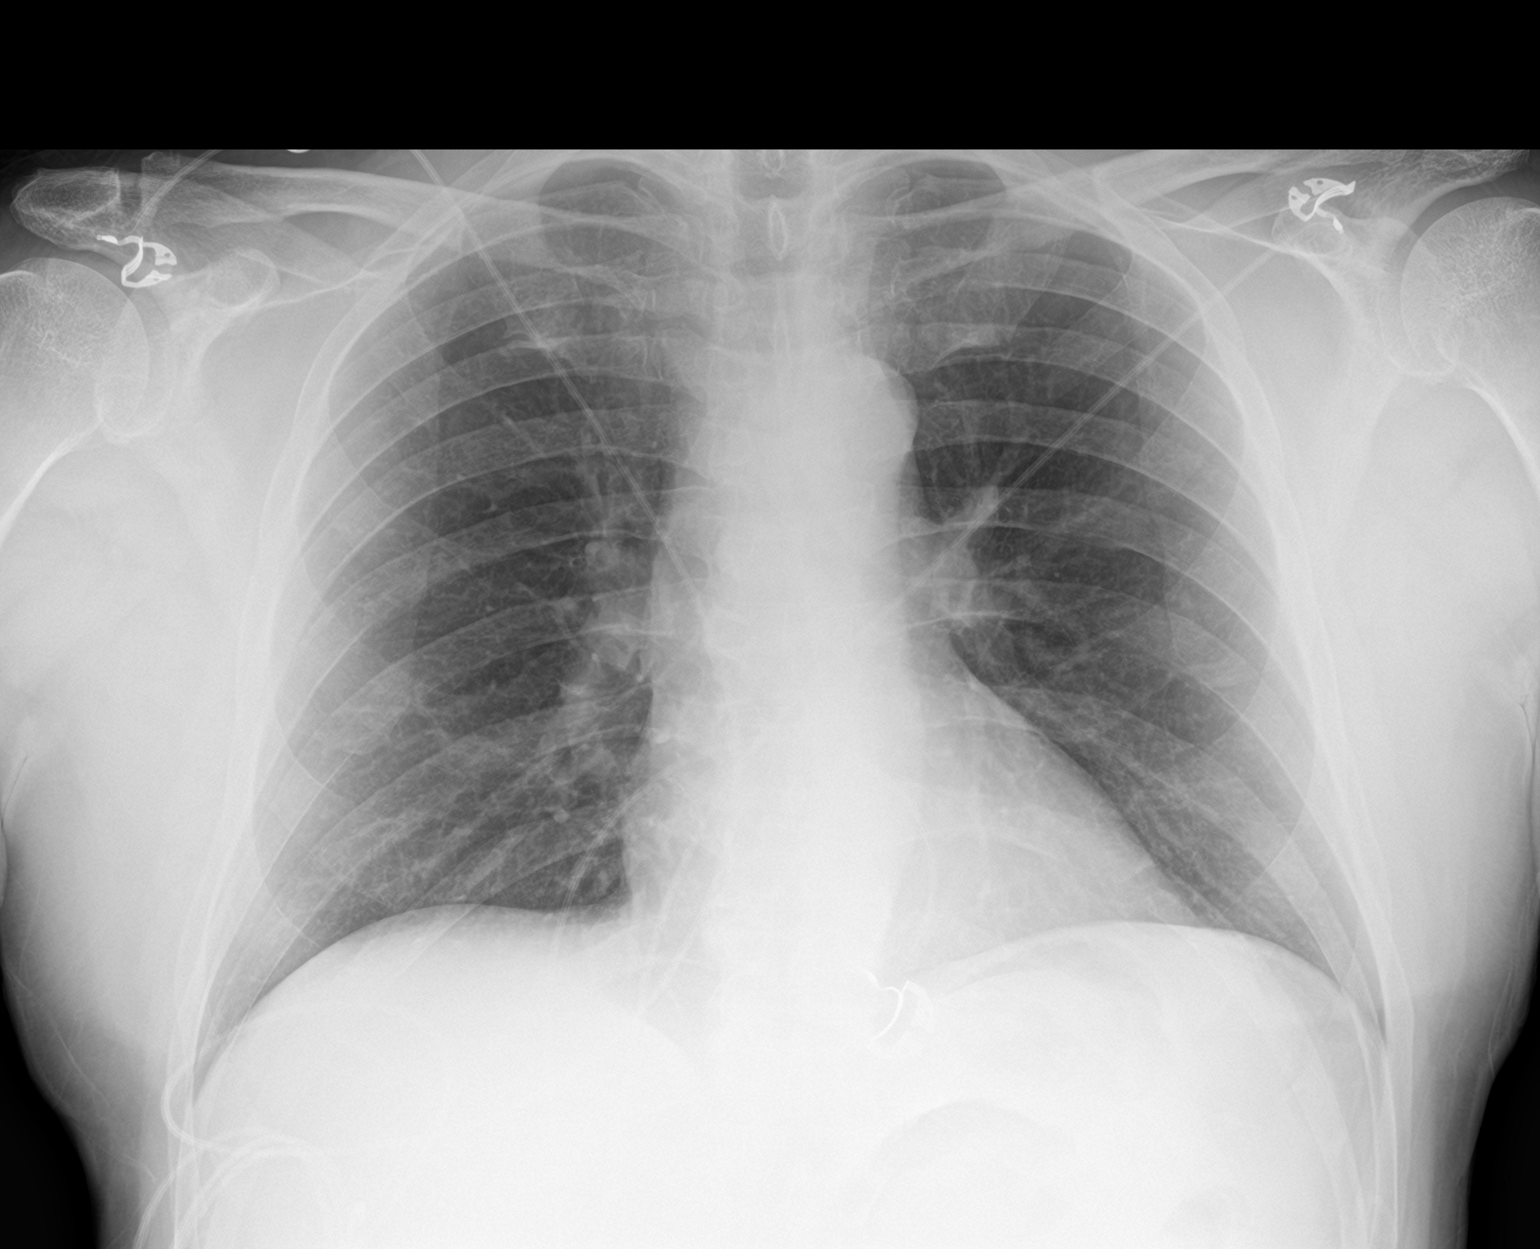

[2 of 2 positions shown; findings below may reference images not displayed]

FINDINGS: Normal sized heart. Clear lungs. Minimal peribronchial thickening.
Midthoracic spine degenerative changes.
IMPRESSION: No acute abnormality.  Minimal chronic bronchitic changes.

## 2020-01-24 IMAGING — DX DG CHEST 1V PORT
1 series · 1 of 1 positions shown · non-contrast
Comparison: 04/16/2018

CLINICAL DATA: Status post coronary bypass grafting

EXAM:
PORTABLE CHEST 1 VIEW

[chest]
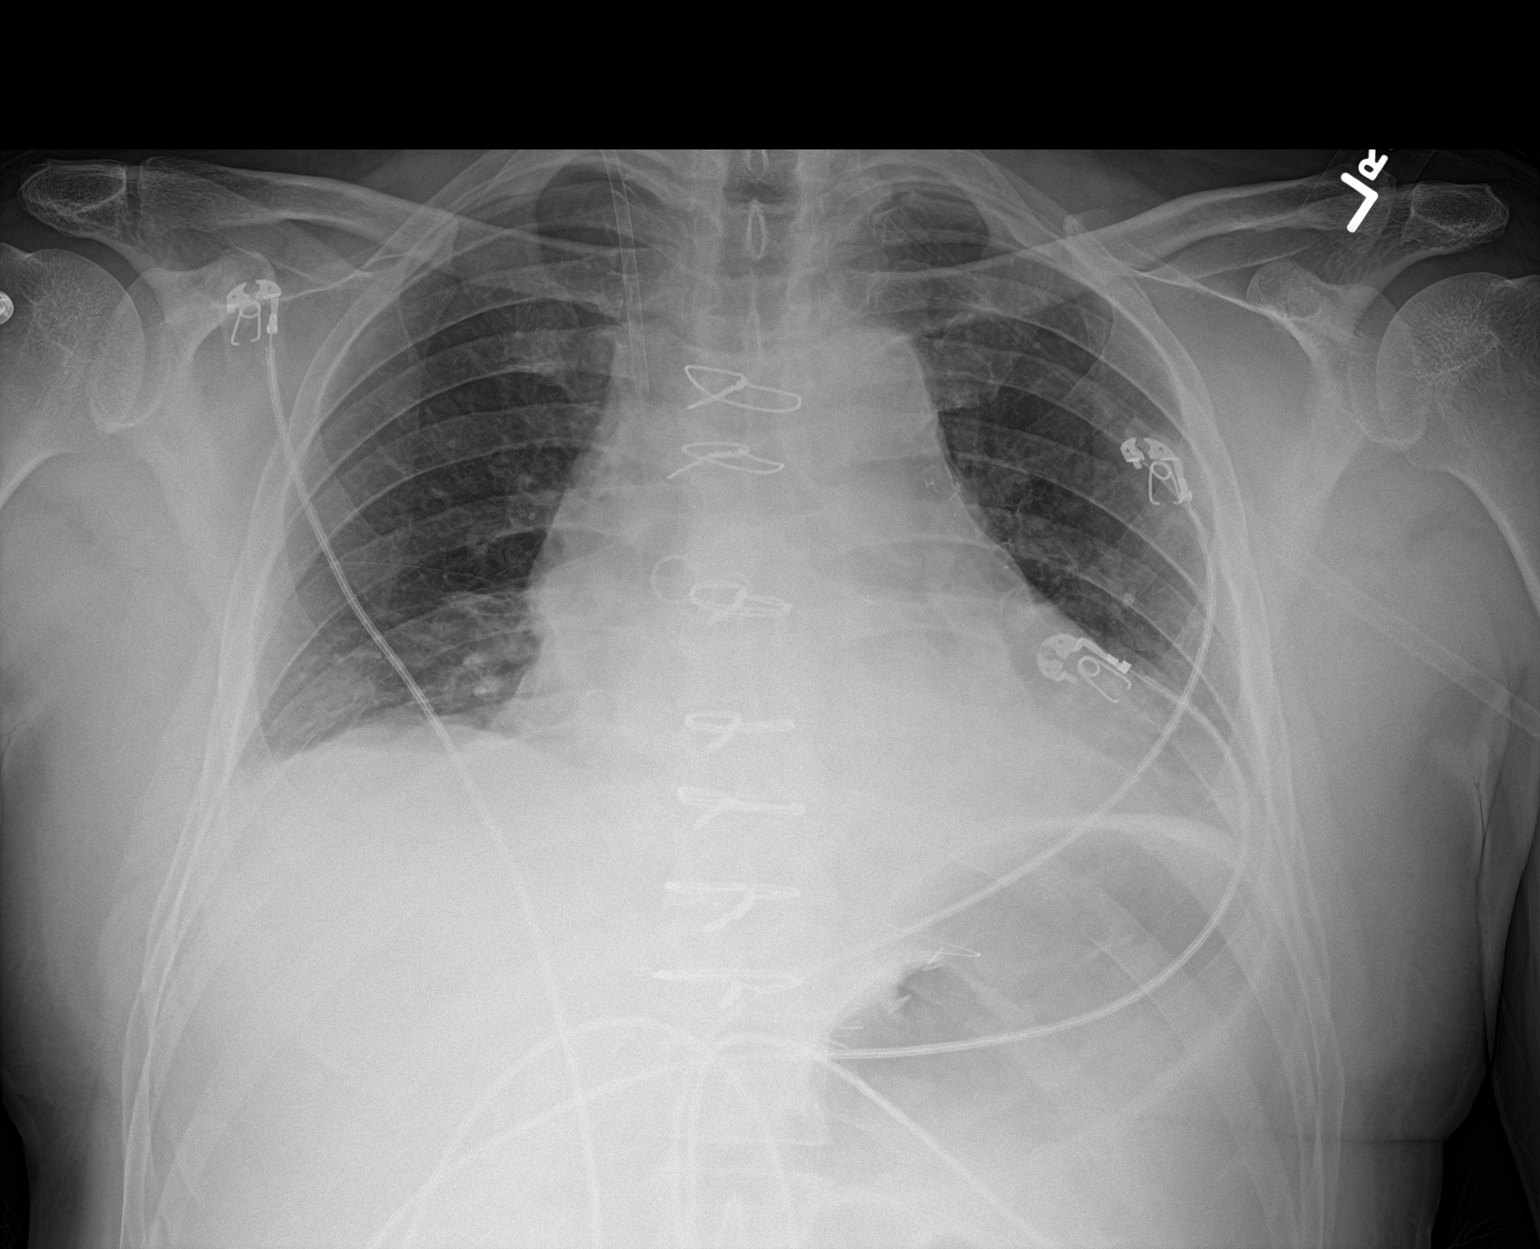

[1 of 1 positions shown; findings below may reference images not displayed]

FINDINGS: Cardiac shadow is enlarged but stable. Swan-Ganz catheter and
mediastinal drain have been removed. Left thoracostomy catheter has
been removed as well. No pneumothorax is noted. Mild right basilar
atelectasis is seen. Postsurgical changes are again noted.
IMPRESSION: Mild right basilar atelectasis.

Interval removal of tubes and lines without evidence of
pneumothorax.

## 2020-01-25 IMAGING — CR DG CHEST 2V
2 series · 2 of 2 positions shown · non-contrast
Comparison: 04/17/2018

CLINICAL DATA: CABG

EXAM:
CHEST - 2 VIEW

[chest pa]
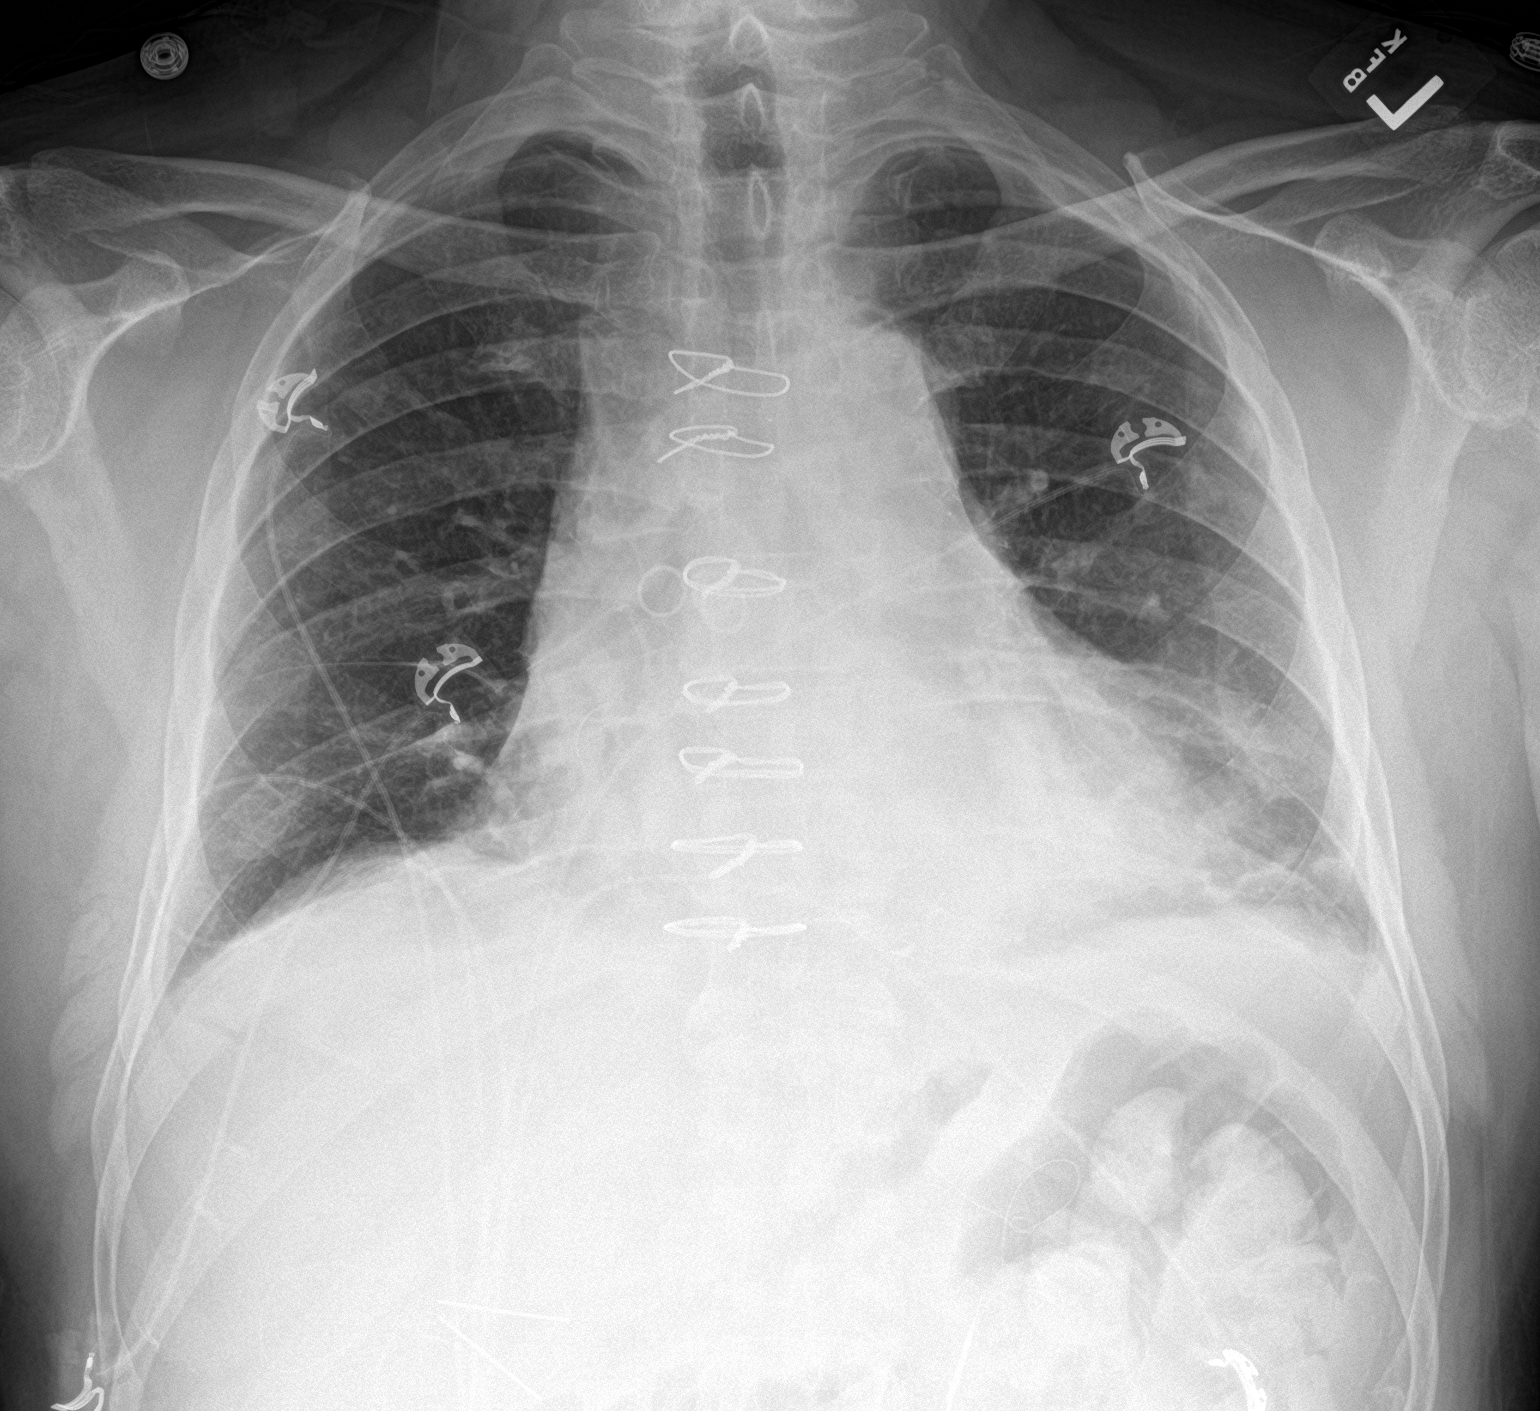

[chest lat]
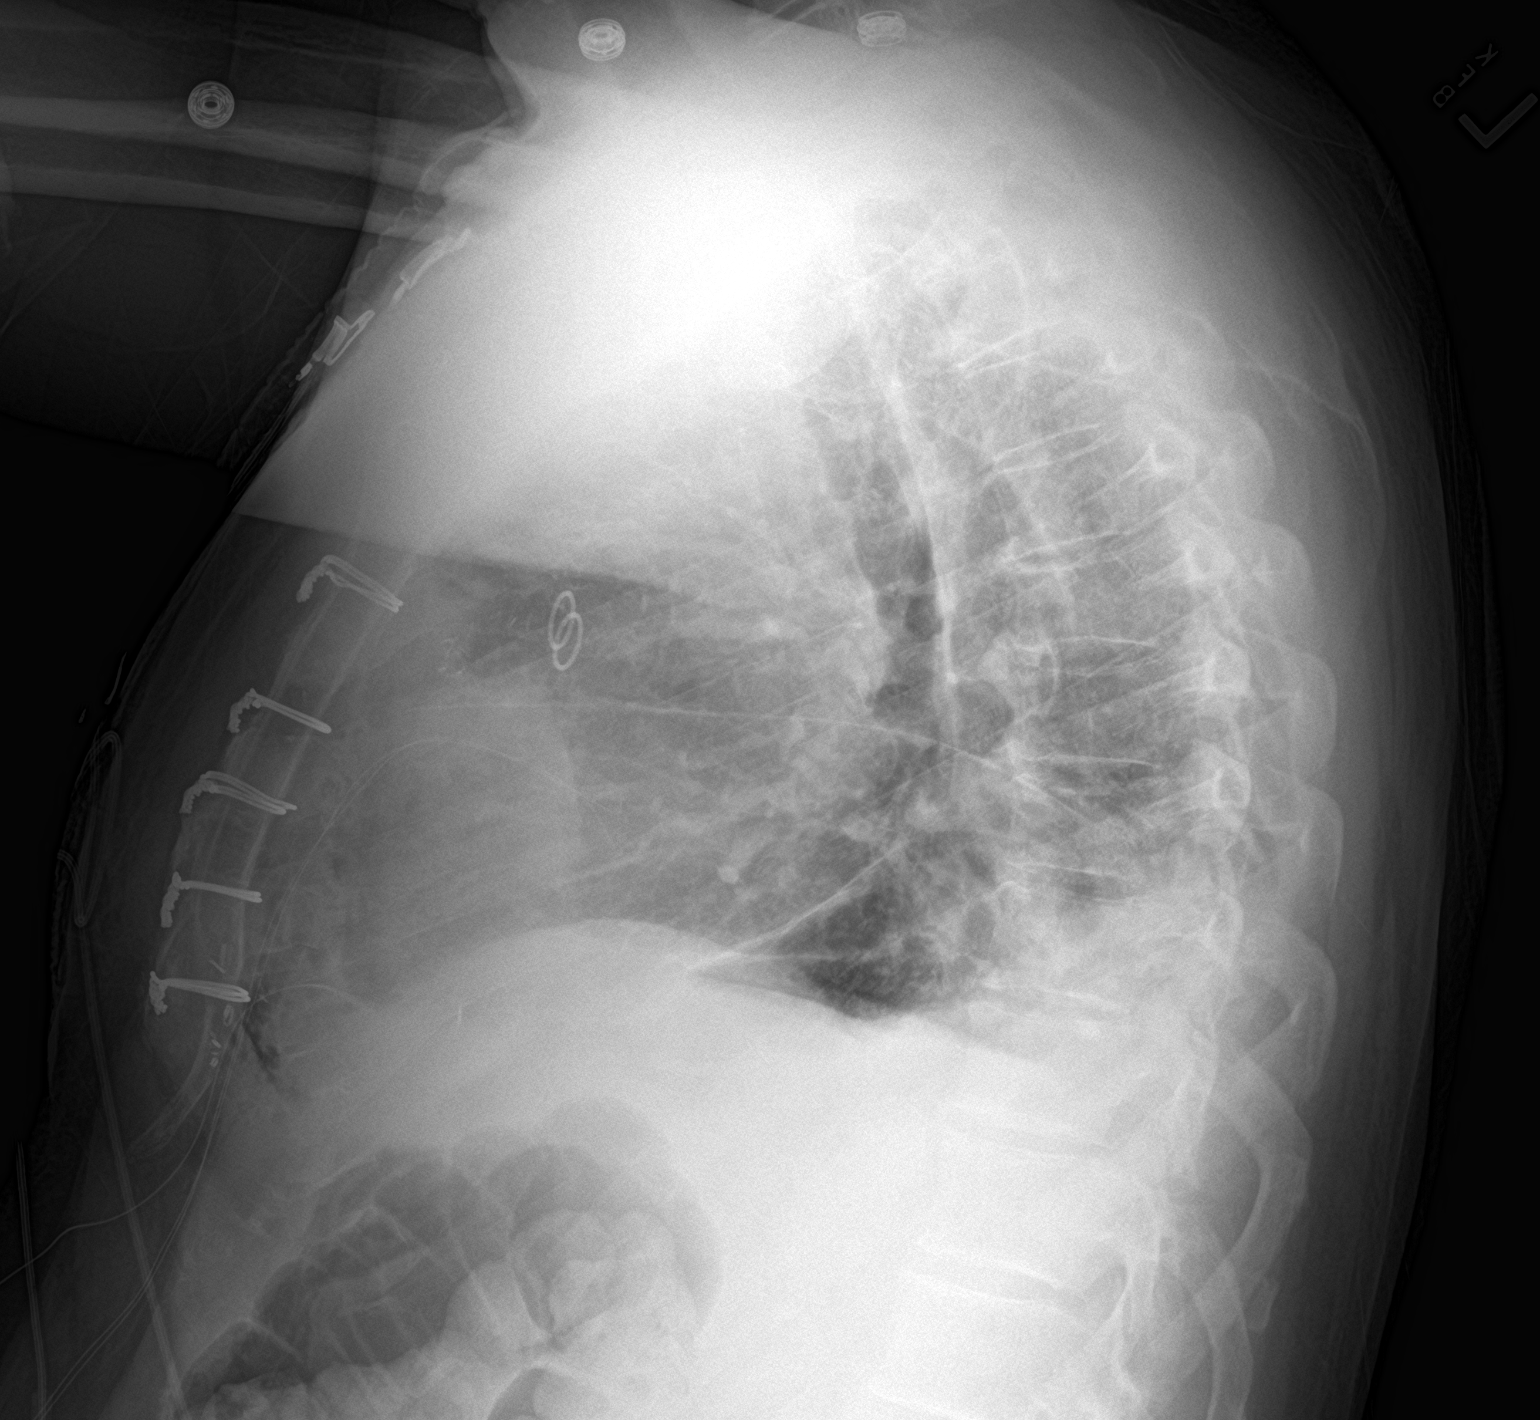

[2 of 2 positions shown; findings below may reference images not displayed]

FINDINGS: Right jugular sheath has been removed.  Negative for pneumothorax

Postop CABG with cardiac enlargement. Negative for heart failure or
edema. Mild atelectasis left greater than right. Small pleural
effusions.
IMPRESSION: Mild bibasilar atelectasis and small effusions.  Negative for edema.

## 2020-02-12 DIAGNOSIS — I2510 Atherosclerotic heart disease of native coronary artery without angina pectoris: Secondary | ICD-10-CM | POA: Diagnosis not present

## 2020-02-12 DIAGNOSIS — R609 Edema, unspecified: Secondary | ICD-10-CM | POA: Diagnosis not present

## 2020-02-12 DIAGNOSIS — G8929 Other chronic pain: Secondary | ICD-10-CM | POA: Diagnosis not present

## 2020-02-12 DIAGNOSIS — E7800 Pure hypercholesterolemia, unspecified: Secondary | ICD-10-CM | POA: Diagnosis not present

## 2020-02-12 DIAGNOSIS — Z6831 Body mass index (BMI) 31.0-31.9, adult: Secondary | ICD-10-CM | POA: Diagnosis not present

## 2020-02-12 DIAGNOSIS — E669 Obesity, unspecified: Secondary | ICD-10-CM | POA: Diagnosis not present

## 2020-02-12 DIAGNOSIS — M159 Polyosteoarthritis, unspecified: Secondary | ICD-10-CM | POA: Diagnosis not present

## 2020-02-12 DIAGNOSIS — I10 Essential (primary) hypertension: Secondary | ICD-10-CM | POA: Diagnosis not present

## 2020-02-12 DIAGNOSIS — M545 Low back pain, unspecified: Secondary | ICD-10-CM | POA: Diagnosis not present

## 2020-02-12 DIAGNOSIS — E291 Testicular hypofunction: Secondary | ICD-10-CM | POA: Diagnosis not present

## 2020-02-16 DIAGNOSIS — I10 Essential (primary) hypertension: Secondary | ICD-10-CM | POA: Insufficient documentation

## 2020-02-17 ENCOUNTER — Other Ambulatory Visit: Payer: Self-pay

## 2020-02-17 ENCOUNTER — Ambulatory Visit: Payer: PPO | Admitting: Cardiology

## 2020-02-17 ENCOUNTER — Encounter: Payer: Self-pay | Admitting: Cardiology

## 2020-02-17 VITALS — BP 114/68 | HR 54 | Ht 69.0 in | Wt 209.6 lb

## 2020-02-17 DIAGNOSIS — E119 Type 2 diabetes mellitus without complications: Secondary | ICD-10-CM | POA: Diagnosis not present

## 2020-02-17 DIAGNOSIS — I10 Essential (primary) hypertension: Secondary | ICD-10-CM

## 2020-02-17 DIAGNOSIS — Z951 Presence of aortocoronary bypass graft: Secondary | ICD-10-CM

## 2020-02-17 DIAGNOSIS — E782 Mixed hyperlipidemia: Secondary | ICD-10-CM

## 2020-02-17 DIAGNOSIS — I2510 Atherosclerotic heart disease of native coronary artery without angina pectoris: Secondary | ICD-10-CM | POA: Diagnosis not present

## 2020-02-17 MED ORDER — NITROGLYCERIN 0.4 MG SL SUBL: 0.4000 mg | SUBLINGUAL_TABLET | SUBLINGUAL | 3 refills | Status: DC | PRN

## 2020-02-17 NOTE — Patient Instructions (Addendum)
Medication Instructions:  Your physician recommends that you continue on your current medications as directed. Please refer to the Current Medication list given to you today BUT WE DID SEND A PRESCRIPTION FOR NITROGLYCERIN FOR YOU TO [place] ONLY AS NEEDED FOR CHEST PAIN. .  *If you need a refill on your cardiac medications before your next appointment, please call your pharmacy*   Lab Work: None ordered  If you have labs (blood work) drawn today and your tests are completely normal, you will receive your results only by: Marland Kitchen MyChart Message (if you have MyChart) OR . A paper copy in the mail If you have any lab test that is abnormal or we need to change your treatment, we will call you to review the results.   Testing/Procedures: None ordered   Follow-Up: At [facility], you and your health needs are our priority.  As part of our continuing mission to provide you with exceptional heart care, we have created designated Provider Care Teams.  These Care Teams include your primary Cardiologist (physician) and Advanced Practice Providers (APPs -  Physician Assistants and Nurse Practitioners) who all work together to provide you with the care you need, when you need it.  We recommend signing up for the patient portal called "MyChart".  Sign up information is provided on this After Visit Summary.  MyChart is used to connect with patients for Virtual Visits (Telemedicine).  Patients are able to view lab/test results, encounter notes, upcoming appointments, etc.  Non-urgent messages can be sent to your provider as well.   To learn more about what you can do with MyChart, go to [email or web address].    Your next appointment:   6 month(s)  The format for your next appointment:   In Person  Provider:   Belva Crome, MD   Other Instructions

## 2020-02-17 NOTE — Progress Notes (Signed)
Cardiology Office Note:    Date:  02/17/2020   ID:  Eddie Nelson, DOB 03/11/1955, MRN [id number]  PCP:  Simone Curia, MD  Cardiologist:  Garwin Brothers, MD   Referring MD: Simone Curia, MD    ASSESSMENT:    1. Coronary artery disease involving native coronary artery of native heart without angina pectoris   2. Essential hypertension   3. S/P CABG x 3   4. Mixed dyslipidemia   5. Diet-controlled diabetes mellitus (HCC)    PLAN:    In order of problems listed above:  1. Coronary artery disease: Secondary prevention stressed to the patient.  Importance of compliance with diet medication stressed any vocalized understanding.  He walks 2 miles a day without any problems. 2. Essential hypertension: Blood pressure stable and diet was emphasized. 3. Mixed dyslipidemia: Lipids were reviewed and they are fine and I congratulated him about this. 4. Diabetes mellitus: Hemoglobin A1c was 6.3 on the KPN sheet and diet was emphasized and weight reduction was stressed he promises to do better. 5. Sublingual nitroglycerin prescription was sent, its protocol and 911 protocol explained and the patient vocalized understanding questions were answered to the patient's satisfaction 6. Patient will be seen in follow-up appointment in 6 months or earlier if the patient has any concerns.   Medication Adjustments/Labs and Tests Ordered: Current medicines are reviewed at length with the patient today.  Concerns regarding medicines are outlined above.  No orders of the defined types were placed in this encounter.  No orders of the defined types were placed in this encounter.    No chief complaint on file.    History of Present Illness:    Eddie Nelson is a 65 y.o. male.  Patient has past medical history of coronary artery disease post CABG surgery, essential hypertension dyslipidemia and diabetes mellitus.  He denies any problems at this time and takes care of activities of daily living.   No chest pain orthopnea or PND.  He walks 2 miles on a regular basis.  At the time of my evaluation, the patient is alert awake oriented and in no distress.  Past Medical History:  Diagnosis Date  . CAD (coronary artery disease), native coronary artery 04/11/2018  . Chronic lumbar pain 04/10/2018  . Diet-controlled diabetes mellitus (HCC) 04/10/2018  . Essential hypertension 04/10/2018  . Hypertension   . Mixed dyslipidemia 04/10/2018  . Osteoarthritis   . S/P CABG x 3 04/15/2018    Past Surgical History:  Procedure Laterality Date  . CORONARY ARTERY BYPASS GRAFT N/A 04/15/2018   Procedure: CORONARY ARTERY BYPASS GRAFTING (CABG), ON PUMP, TIMES THREE, USING LEFT INTERNAL MAMMARY ARTERY  AND ENDOSCOPICALLY HARVESTED LEFT GREATER SAPHENOUS VEIN;  Surgeon: Kerin Perna, MD;  Location: [facility] OR;  Service: Open Heart Surgery;  Laterality: N/A;  . LEFT HEART CATH AND CORONARY ANGIOGRAPHY N/A 04/11/2018   Procedure: LEFT HEART CATH AND CORONARY ANGIOGRAPHY;  Surgeon: Lennette Bihari, MD;  Location: [facility];  Service: Cardiovascular;  Laterality: N/A;  . TEE WITHOUT CARDIOVERSION N/A 04/15/2018   Procedure: TRANSESOPHAGEAL ECHOCARDIOGRAM (TEE);  Surgeon: Donata Clay, Theron Arista, MD;  Location: [facility] OR;  Service: Open Heart Surgery;  Laterality: N/A;    Current Medications: Current Meds  Medication Sig  . aspirin EC 325 MG EC tablet Take 1 tablet (325 mg total) by mouth daily.  Marland Kitchen atorvastatin (LIPITOR) 80 MG tablet Take 1 tablet (80 mg total) by mouth daily at 6 PM.  . ezetimibe (  ZETIA) 10 MG tablet Take 10 mg by mouth daily.  . furosemide (LASIX) 20 MG tablet Take 20 mg by mouth daily.  Marland Kitchen lisinopril (PRINIVIL,ZESTRIL) 10 MG tablet Take 1 tablet by mouth daily.  Marland Kitchen oxyCODONE-acetaminophen (PERCOCET/ROXICET) 5-325 MG tablet Take 1 tablet by mouth every 6 (six) hours as needed.  Marland Kitchen VASCEPA 1 g capsule Take 2 g by mouth 2 (two) times daily.     Allergies:   Tramadol   Social History   Socioeconomic  History  . Marital status: Single    Spouse name: Not on file  . Number of children: Not on file  . Years of education: Not on file  . Highest education level: Not on file  Occupational History  . Not on file  Tobacco Use  . Smoking status: Former Games developer  . Smokeless tobacco: Never Used  Substance and Sexual Activity  . Alcohol use: Not on file  . Drug use: Not on file  . Sexual activity: Not on file  Other Topics Concern  . Not on file  Social History Narrative  . Not on file   Social Determinants of Health   Financial Resource Strain: Not on file  Food Insecurity: Not on file  Transportation Needs: Not on file  Physical Activity: Not on file  Stress: Not on file  Social Connections: Not on file     Family History: The patient's family history includes Heart disease in his father; Hodgkin's lymphoma in his sister; Hyperlipidemia in his brother.  ROS:   Please see the history of present illness.    All other systems reviewed and are negative.  EKGs/Labs/Other Studies Reviewed:    The following studies were reviewed today: EKG reveals sinus rhythm and nonspecific ST-T changes   Recent Labs: No results found for requested labs within last 8760 hours.  Recent Lipid Panel    Component Value Date/Time   CHOL 301 (H) 04/10/2018 1223   TRIG 114 04/10/2018 1223   HDL 48 04/10/2018 1223   CHOLHDL 6.3 (H) 04/10/2018 1223   LDLCALC 230 (H) 04/10/2018 1223    Physical Exam:    VS:  BP 114/68   Pulse (!) 54   Ht 5\' 9"  (1.753 m)   Wt 209 lb 9.6 oz (95.1 kg)   SpO2 96%   BMI 30.95 kg/m     Wt Readings from Last 3 Encounters:  02/17/20 209 lb 9.6 oz (95.1 kg)  10/09/19 224 lb 3.2 oz (101.7 kg)  06/26/18 205 lb (93 kg)     GEN: Patient is in no acute distress HEENT: Normal NECK: No JVD; No carotid bruits LYMPHATICS: No lymphadenopathy CARDIAC: Hear sounds regular, 2/6 systolic murmur at the apex. RESPIRATORY:  Clear to auscultation without rales, wheezing or  rhonchi  ABDOMEN: Soft, non-tender, non-distended MUSCULOSKELETAL:  No edema; No deformity  SKIN: Warm and dry NEUROLOGIC:  Alert and oriented x 3 PSYCHIATRIC:  Normal affect   Signed, 06/28/18, MD  02/17/2020 1:01 PM    [facility]

## 2020-03-11 DIAGNOSIS — R609 Edema, unspecified: Secondary | ICD-10-CM | POA: Diagnosis not present

## 2020-03-11 DIAGNOSIS — E7800 Pure hypercholesterolemia, unspecified: Secondary | ICD-10-CM | POA: Diagnosis not present

## 2020-03-11 DIAGNOSIS — I10 Essential (primary) hypertension: Secondary | ICD-10-CM | POA: Diagnosis not present

## 2020-03-11 DIAGNOSIS — E669 Obesity, unspecified: Secondary | ICD-10-CM | POA: Diagnosis not present

## 2020-03-11 DIAGNOSIS — Z6831 Body mass index (BMI) 31.0-31.9, adult: Secondary | ICD-10-CM | POA: Diagnosis not present

## 2020-03-11 DIAGNOSIS — E291 Testicular hypofunction: Secondary | ICD-10-CM | POA: Diagnosis not present

## 2020-03-11 DIAGNOSIS — G8929 Other chronic pain: Secondary | ICD-10-CM | POA: Diagnosis not present

## 2020-03-11 DIAGNOSIS — M159 Polyosteoarthritis, unspecified: Secondary | ICD-10-CM | POA: Diagnosis not present

## 2020-03-11 DIAGNOSIS — M545 Low back pain, unspecified: Secondary | ICD-10-CM | POA: Diagnosis not present

## 2020-03-11 DIAGNOSIS — I2510 Atherosclerotic heart disease of native coronary artery without angina pectoris: Secondary | ICD-10-CM | POA: Diagnosis not present

## 2020-04-08 DIAGNOSIS — Z6831 Body mass index (BMI) 31.0-31.9, adult: Secondary | ICD-10-CM | POA: Diagnosis not present

## 2020-04-08 DIAGNOSIS — I10 Essential (primary) hypertension: Secondary | ICD-10-CM | POA: Diagnosis not present

## 2020-04-08 DIAGNOSIS — E7800 Pure hypercholesterolemia, unspecified: Secondary | ICD-10-CM | POA: Diagnosis not present

## 2020-04-08 DIAGNOSIS — E669 Obesity, unspecified: Secondary | ICD-10-CM | POA: Diagnosis not present

## 2020-04-08 DIAGNOSIS — M159 Polyosteoarthritis, unspecified: Secondary | ICD-10-CM | POA: Diagnosis not present

## 2020-04-08 DIAGNOSIS — E291 Testicular hypofunction: Secondary | ICD-10-CM | POA: Diagnosis not present

## 2020-04-08 DIAGNOSIS — I2510 Atherosclerotic heart disease of native coronary artery without angina pectoris: Secondary | ICD-10-CM | POA: Diagnosis not present

## 2020-04-08 DIAGNOSIS — G8929 Other chronic pain: Secondary | ICD-10-CM | POA: Diagnosis not present

## 2020-04-08 DIAGNOSIS — R609 Edema, unspecified: Secondary | ICD-10-CM | POA: Diagnosis not present

## 2020-04-08 DIAGNOSIS — M545 Low back pain, unspecified: Secondary | ICD-10-CM | POA: Diagnosis not present

## 2020-05-06 DIAGNOSIS — Z6830 Body mass index (BMI) 30.0-30.9, adult: Secondary | ICD-10-CM | POA: Diagnosis not present

## 2020-05-06 DIAGNOSIS — R609 Edema, unspecified: Secondary | ICD-10-CM | POA: Diagnosis not present

## 2020-05-06 DIAGNOSIS — I2510 Atherosclerotic heart disease of native coronary artery without angina pectoris: Secondary | ICD-10-CM | POA: Diagnosis not present

## 2020-05-06 DIAGNOSIS — M545 Low back pain, unspecified: Secondary | ICD-10-CM | POA: Diagnosis not present

## 2020-05-06 DIAGNOSIS — M159 Polyosteoarthritis, unspecified: Secondary | ICD-10-CM | POA: Diagnosis not present

## 2020-05-06 DIAGNOSIS — E669 Obesity, unspecified: Secondary | ICD-10-CM | POA: Diagnosis not present

## 2020-05-06 DIAGNOSIS — G8929 Other chronic pain: Secondary | ICD-10-CM | POA: Diagnosis not present

## 2020-05-06 DIAGNOSIS — E7800 Pure hypercholesterolemia, unspecified: Secondary | ICD-10-CM | POA: Diagnosis not present

## 2020-05-06 DIAGNOSIS — I10 Essential (primary) hypertension: Secondary | ICD-10-CM | POA: Diagnosis not present

## 2020-05-06 DIAGNOSIS — E291 Testicular hypofunction: Secondary | ICD-10-CM | POA: Diagnosis not present

## 2020-06-03 DIAGNOSIS — M159 Polyosteoarthritis, unspecified: Secondary | ICD-10-CM | POA: Diagnosis not present

## 2020-06-03 DIAGNOSIS — E291 Testicular hypofunction: Secondary | ICD-10-CM | POA: Diagnosis not present

## 2020-06-03 DIAGNOSIS — M545 Low back pain, unspecified: Secondary | ICD-10-CM | POA: Diagnosis not present

## 2020-06-03 DIAGNOSIS — I10 Essential (primary) hypertension: Secondary | ICD-10-CM | POA: Diagnosis not present

## 2020-06-03 DIAGNOSIS — R609 Edema, unspecified: Secondary | ICD-10-CM | POA: Diagnosis not present

## 2020-06-03 DIAGNOSIS — E669 Obesity, unspecified: Secondary | ICD-10-CM | POA: Diagnosis not present

## 2020-06-03 DIAGNOSIS — E7800 Pure hypercholesterolemia, unspecified: Secondary | ICD-10-CM | POA: Diagnosis not present

## 2020-06-03 DIAGNOSIS — I2510 Atherosclerotic heart disease of native coronary artery without angina pectoris: Secondary | ICD-10-CM | POA: Diagnosis not present

## 2020-06-03 DIAGNOSIS — G8929 Other chronic pain: Secondary | ICD-10-CM | POA: Diagnosis not present

## 2020-06-03 DIAGNOSIS — Z6830 Body mass index (BMI) 30.0-30.9, adult: Secondary | ICD-10-CM | POA: Diagnosis not present

## 2020-07-01 DIAGNOSIS — E669 Obesity, unspecified: Secondary | ICD-10-CM | POA: Diagnosis not present

## 2020-07-01 DIAGNOSIS — I10 Essential (primary) hypertension: Secondary | ICD-10-CM | POA: Diagnosis not present

## 2020-07-01 DIAGNOSIS — M545 Low back pain, unspecified: Secondary | ICD-10-CM | POA: Diagnosis not present

## 2020-07-01 DIAGNOSIS — I2510 Atherosclerotic heart disease of native coronary artery without angina pectoris: Secondary | ICD-10-CM | POA: Diagnosis not present

## 2020-07-01 DIAGNOSIS — Z6830 Body mass index (BMI) 30.0-30.9, adult: Secondary | ICD-10-CM | POA: Diagnosis not present

## 2020-07-01 DIAGNOSIS — M159 Polyosteoarthritis, unspecified: Secondary | ICD-10-CM | POA: Diagnosis not present

## 2020-07-01 DIAGNOSIS — E291 Testicular hypofunction: Secondary | ICD-10-CM | POA: Diagnosis not present

## 2020-07-01 DIAGNOSIS — R609 Edema, unspecified: Secondary | ICD-10-CM | POA: Diagnosis not present

## 2020-07-01 DIAGNOSIS — E7800 Pure hypercholesterolemia, unspecified: Secondary | ICD-10-CM | POA: Diagnosis not present

## 2020-07-01 DIAGNOSIS — G8929 Other chronic pain: Secondary | ICD-10-CM | POA: Diagnosis not present

## 2020-07-29 DIAGNOSIS — R609 Edema, unspecified: Secondary | ICD-10-CM | POA: Diagnosis not present

## 2020-07-29 DIAGNOSIS — G8929 Other chronic pain: Secondary | ICD-10-CM | POA: Diagnosis not present

## 2020-07-29 DIAGNOSIS — Z139 Encounter for screening, unspecified: Secondary | ICD-10-CM | POA: Diagnosis not present

## 2020-07-29 DIAGNOSIS — I2510 Atherosclerotic heart disease of native coronary artery without angina pectoris: Secondary | ICD-10-CM | POA: Diagnosis not present

## 2020-07-29 DIAGNOSIS — M545 Low back pain, unspecified: Secondary | ICD-10-CM | POA: Diagnosis not present

## 2020-07-29 DIAGNOSIS — Z1331 Encounter for screening for depression: Secondary | ICD-10-CM | POA: Diagnosis not present

## 2020-07-29 DIAGNOSIS — E669 Obesity, unspecified: Secondary | ICD-10-CM | POA: Diagnosis not present

## 2020-07-29 DIAGNOSIS — I10 Essential (primary) hypertension: Secondary | ICD-10-CM | POA: Diagnosis not present

## 2020-07-29 DIAGNOSIS — E291 Testicular hypofunction: Secondary | ICD-10-CM | POA: Diagnosis not present

## 2020-07-29 DIAGNOSIS — E7800 Pure hypercholesterolemia, unspecified: Secondary | ICD-10-CM | POA: Diagnosis not present

## 2020-07-29 DIAGNOSIS — Z6830 Body mass index (BMI) 30.0-30.9, adult: Secondary | ICD-10-CM | POA: Diagnosis not present

## 2020-07-29 DIAGNOSIS — M159 Polyosteoarthritis, unspecified: Secondary | ICD-10-CM | POA: Diagnosis not present

## 2020-08-26 DIAGNOSIS — E7800 Pure hypercholesterolemia, unspecified: Secondary | ICD-10-CM | POA: Diagnosis not present

## 2020-08-26 DIAGNOSIS — I10 Essential (primary) hypertension: Secondary | ICD-10-CM | POA: Diagnosis not present

## 2020-08-26 DIAGNOSIS — M545 Low back pain, unspecified: Secondary | ICD-10-CM | POA: Diagnosis not present

## 2020-08-26 DIAGNOSIS — R609 Edema, unspecified: Secondary | ICD-10-CM | POA: Diagnosis not present

## 2020-08-26 DIAGNOSIS — E291 Testicular hypofunction: Secondary | ICD-10-CM | POA: Diagnosis not present

## 2020-08-26 DIAGNOSIS — G8929 Other chronic pain: Secondary | ICD-10-CM | POA: Diagnosis not present

## 2020-08-26 DIAGNOSIS — M159 Polyosteoarthritis, unspecified: Secondary | ICD-10-CM | POA: Diagnosis not present

## 2020-08-26 DIAGNOSIS — K047 Periapical abscess without sinus: Secondary | ICD-10-CM | POA: Diagnosis not present

## 2020-08-26 DIAGNOSIS — E669 Obesity, unspecified: Secondary | ICD-10-CM | POA: Diagnosis not present

## 2020-08-26 DIAGNOSIS — I2510 Atherosclerotic heart disease of native coronary artery without angina pectoris: Secondary | ICD-10-CM | POA: Diagnosis not present

## 2020-08-26 DIAGNOSIS — Z6830 Body mass index (BMI) 30.0-30.9, adult: Secondary | ICD-10-CM | POA: Diagnosis not present

## 2020-09-08 DIAGNOSIS — K0889 Other specified disorders of teeth and supporting structures: Secondary | ICD-10-CM | POA: Diagnosis not present

## 2020-09-08 DIAGNOSIS — E669 Obesity, unspecified: Secondary | ICD-10-CM | POA: Diagnosis not present

## 2020-09-08 DIAGNOSIS — M545 Low back pain, unspecified: Secondary | ICD-10-CM | POA: Diagnosis not present

## 2020-09-08 DIAGNOSIS — I2510 Atherosclerotic heart disease of native coronary artery without angina pectoris: Secondary | ICD-10-CM | POA: Diagnosis not present

## 2020-09-08 DIAGNOSIS — G8929 Other chronic pain: Secondary | ICD-10-CM | POA: Diagnosis not present

## 2020-09-08 DIAGNOSIS — E7800 Pure hypercholesterolemia, unspecified: Secondary | ICD-10-CM | POA: Diagnosis not present

## 2020-09-08 DIAGNOSIS — Z6830 Body mass index (BMI) 30.0-30.9, adult: Secondary | ICD-10-CM | POA: Diagnosis not present

## 2020-09-08 DIAGNOSIS — I10 Essential (primary) hypertension: Secondary | ICD-10-CM | POA: Diagnosis not present

## 2020-09-08 DIAGNOSIS — M159 Polyosteoarthritis, unspecified: Secondary | ICD-10-CM | POA: Diagnosis not present

## 2020-09-08 DIAGNOSIS — R609 Edema, unspecified: Secondary | ICD-10-CM | POA: Diagnosis not present

## 2020-09-08 DIAGNOSIS — E291 Testicular hypofunction: Secondary | ICD-10-CM | POA: Diagnosis not present

## 2020-09-17 ENCOUNTER — Ambulatory Visit: Payer: PPO | Admitting: Cardiology

## 2020-09-23 DIAGNOSIS — E7800 Pure hypercholesterolemia, unspecified: Secondary | ICD-10-CM | POA: Diagnosis not present

## 2020-09-23 DIAGNOSIS — M545 Low back pain, unspecified: Secondary | ICD-10-CM | POA: Diagnosis not present

## 2020-09-23 DIAGNOSIS — G8929 Other chronic pain: Secondary | ICD-10-CM | POA: Diagnosis not present

## 2020-09-23 DIAGNOSIS — Z6830 Body mass index (BMI) 30.0-30.9, adult: Secondary | ICD-10-CM | POA: Diagnosis not present

## 2020-09-23 DIAGNOSIS — E291 Testicular hypofunction: Secondary | ICD-10-CM | POA: Diagnosis not present

## 2020-09-23 DIAGNOSIS — M159 Polyosteoarthritis, unspecified: Secondary | ICD-10-CM | POA: Diagnosis not present

## 2020-09-23 DIAGNOSIS — R609 Edema, unspecified: Secondary | ICD-10-CM | POA: Diagnosis not present

## 2020-09-23 DIAGNOSIS — I2510 Atherosclerotic heart disease of native coronary artery without angina pectoris: Secondary | ICD-10-CM | POA: Diagnosis not present

## 2020-09-23 DIAGNOSIS — I10 Essential (primary) hypertension: Secondary | ICD-10-CM | POA: Diagnosis not present

## 2020-09-23 DIAGNOSIS — E669 Obesity, unspecified: Secondary | ICD-10-CM | POA: Diagnosis not present

## 2020-10-08 ENCOUNTER — Other Ambulatory Visit: Payer: Self-pay

## 2020-10-12 ENCOUNTER — Ambulatory Visit: Payer: PPO | Admitting: Cardiology

## 2020-10-12 ENCOUNTER — Encounter: Payer: Self-pay | Admitting: Cardiology

## 2020-10-12 ENCOUNTER — Other Ambulatory Visit: Payer: Self-pay

## 2020-10-12 VITALS — BP 132/66 | HR 47 | Ht 69.0 in | Wt 198.0 lb

## 2020-10-12 DIAGNOSIS — E119 Type 2 diabetes mellitus without complications: Secondary | ICD-10-CM

## 2020-10-12 DIAGNOSIS — I2510 Atherosclerotic heart disease of native coronary artery without angina pectoris: Secondary | ICD-10-CM | POA: Diagnosis not present

## 2020-10-12 DIAGNOSIS — I10 Essential (primary) hypertension: Secondary | ICD-10-CM

## 2020-10-12 DIAGNOSIS — Z951 Presence of aortocoronary bypass graft: Secondary | ICD-10-CM | POA: Diagnosis not present

## 2020-10-12 DIAGNOSIS — E782 Mixed hyperlipidemia: Secondary | ICD-10-CM | POA: Diagnosis not present

## 2020-10-12 NOTE — Patient Instructions (Signed)

## 2020-10-12 NOTE — Progress Notes (Signed)
Cardiology Office Note:    Date:  10/12/2020   ID:  Eddie Nelson, Eddie Nelson 08/31/1955, MRN [id number]  PCP:  Simone Curia, MD  Cardiologist:  Garwin Brothers, MD   Referring MD: Simone Curia, MD    ASSESSMENT:    1. Coronary artery disease involving native coronary artery of native heart without angina pectoris   2. Essential hypertension   3. Diet-controlled diabetes mellitus (HCC)   4. Mixed dyslipidemia   5. S/P CABG x 3    PLAN:    In order of problems listed above:  Coronary artery disease: Secondary prevention stressed with the patient.  Importance of compliance with diet medication stressed any vocalized understanding.  He was advised to walk at least half an hour a day 5 days a week and he promises to do so. Essential hypertension: Blood pressure stable and diet was emphasized. Mixed dyslipidemia: Lipids were reviewed.  He is happy about it.  He walks and diet as well) to do better.  He has no significant issues or concerns at this time that needed to be addressed.  Questions were answered to his satisfaction. Patient will be seen in follow-up appointment in 6 months or earlier if the patient has any concerns    Medication Adjustments/Labs and Tests Ordered: Current medicines are reviewed at length with the patient today.  Concerns regarding medicines are outlined above.  No orders of the defined types were placed in this encounter.  No orders of the defined types were placed in this encounter.    No chief complaint on file.    History of Present Illness:    Eddie Nelson is a 65 y.o. male.  Patient has past medical history of coronary artery disease, essential hypertension and dyslipidemia.  He denies any problems at this time and takes care of activities of daily living.  No chest pain orthopnea or PND.  At the time of my evaluation, the patient is alert awake oriented and in no distress.  Past Medical History:  Diagnosis Date   CAD (coronary artery  disease), native coronary artery 04/11/2018   Chronic lumbar pain 04/10/2018   Diet-controlled diabetes mellitus (HCC) 04/10/2018   Essential hypertension 04/10/2018   Hypertension    Mixed dyslipidemia 04/10/2018   Osteoarthritis    S/P CABG x 3 04/15/2018    Past Surgical History:  Procedure Laterality Date   CORONARY ARTERY BYPASS GRAFT N/A 04/15/2018   Procedure: CORONARY ARTERY BYPASS GRAFTING (CABG), ON PUMP, TIMES THREE, USING LEFT INTERNAL MAMMARY ARTERY  AND ENDOSCOPICALLY HARVESTED LEFT GREATER SAPHENOUS VEIN;  Surgeon: Kerin Perna, MD;  Location: [facility] OR;  Service: Open Heart Surgery;  Laterality: N/A;   LEFT HEART CATH AND CORONARY ANGIOGRAPHY N/A 04/11/2018   Procedure: LEFT HEART CATH AND CORONARY ANGIOGRAPHY;  Surgeon: Lennette Bihari, MD;  Location: [facility];  Service: Cardiovascular;  Laterality: N/A;   TEE WITHOUT CARDIOVERSION N/A 04/15/2018   Procedure: TRANSESOPHAGEAL ECHOCARDIOGRAM (TEE);  Surgeon: Donata Clay, Theron Arista, MD;  Location: [facility] OR;  Service: Open Heart Surgery;  Laterality: N/A;    Current Medications: Current Meds  Medication Sig   aspirin EC 325 MG EC tablet Take 1 tablet (325 mg total) by mouth daily.   atorvastatin (LIPITOR) 80 MG tablet Take 1 tablet (80 mg total) by mouth daily at 6 PM.   ezetimibe (ZETIA) 10 MG tablet Take 10 mg by mouth daily.   furosemide (LASIX) 20 MG tablet Take 20 mg by mouth daily.  lisinopril (PRINIVIL,ZESTRIL) 10 MG tablet Take 1 tablet by mouth daily.   metoprolol succinate (TOPROL-XL) 25 MG 24 hr tablet Take 25 mg by mouth daily.   nitroGLYCERIN (NITROSTAT) 0.4 MG SL tablet Place 0.4 mg under the tongue every 5 (five) minutes as needed for chest pain.   oxyCODONE-acetaminophen (PERCOCET/ROXICET) 5-325 MG tablet Take 1 tablet by mouth every 6 (six) hours as needed for pain.   VASCEPA 1 g capsule Take 2 g by mouth 2 (two) times daily.     Allergies:   Tramadol   Social History   Socioeconomic History   Marital  status: Single    Spouse name: Not on file   Number of children: Not on file   Years of education: Not on file   Highest education level: Not on file  Occupational History   Not on file  Tobacco Use   Smoking status: Former   Smokeless tobacco: Never  Substance and Sexual Activity   Alcohol use: Not on file   Drug use: Not on file   Sexual activity: Not on file  Other Topics Concern   Not on file  Social History Narrative   Not on file   Social Determinants of Health   Financial Resource Strain: Not on file  Food Insecurity: Not on file  Transportation Needs: Not on file  Physical Activity: Not on file  Stress: Not on file  Social Connections: Not on file     Family History: The patient's family history includes Heart disease in his father; Hodgkin's lymphoma in his sister; Hyperlipidemia in his brother.  ROS:   Please see the history of present illness.    All other systems reviewed and are negative.  EKGs/Labs/Other Studies Reviewed:    The following studies were reviewed today: I discussed my findings with the patient at length.  EKG has revealed sinus rhythm and nonspecific ST-T changes   Recent Labs: No results found for requested labs within last 8760 hours.  Recent Lipid Panel    Component Value Date/Time   CHOL 301 (H) 04/10/2018 1223   TRIG 114 04/10/2018 1223   HDL 48 04/10/2018 1223   CHOLHDL 6.3 (H) 04/10/2018 1223   LDLCALC 230 (H) 04/10/2018 1223    Physical Exam:    VS:  BP 132/66   Pulse (!) 47   Ht 5\' 9"  (1.753 m)   Wt 198 lb (89.8 kg)   SpO2 95%   BMI 29.24 kg/m     Wt Readings from Last 3 Encounters:  10/12/20 198 lb (89.8 kg)  02/17/20 209 lb 9.6 oz (95.1 kg)  10/09/19 224 lb 3.2 oz (101.7 kg)     GEN: Patient is in no acute distress HEENT: Normal NECK: No JVD; No carotid bruits LYMPHATICS: No lymphadenopathy CARDIAC: Hear sounds regular, 2/6 systolic murmur at the apex. RESPIRATORY:  Clear to auscultation without rales,  wheezing or rhonchi  ABDOMEN: Soft, non-tender, non-distended MUSCULOSKELETAL:  No edema; No deformity  SKIN: Warm and dry NEUROLOGIC:  Alert and oriented x 3 PSYCHIATRIC:  Normal affect   Signed, 10/11/19, MD  10/12/2020 3:15 PM    [facility]

## 2020-10-21 DIAGNOSIS — I10 Essential (primary) hypertension: Secondary | ICD-10-CM | POA: Diagnosis not present

## 2020-10-21 DIAGNOSIS — E669 Obesity, unspecified: Secondary | ICD-10-CM | POA: Diagnosis not present

## 2020-10-21 DIAGNOSIS — I2510 Atherosclerotic heart disease of native coronary artery without angina pectoris: Secondary | ICD-10-CM | POA: Diagnosis not present

## 2020-10-21 DIAGNOSIS — M159 Polyosteoarthritis, unspecified: Secondary | ICD-10-CM | POA: Diagnosis not present

## 2020-10-21 DIAGNOSIS — Z9181 History of falling: Secondary | ICD-10-CM | POA: Diagnosis not present

## 2020-10-21 DIAGNOSIS — Z6830 Body mass index (BMI) 30.0-30.9, adult: Secondary | ICD-10-CM | POA: Diagnosis not present

## 2020-10-21 DIAGNOSIS — M545 Low back pain, unspecified: Secondary | ICD-10-CM | POA: Diagnosis not present

## 2020-10-21 DIAGNOSIS — G8929 Other chronic pain: Secondary | ICD-10-CM | POA: Diagnosis not present

## 2020-10-21 DIAGNOSIS — R609 Edema, unspecified: Secondary | ICD-10-CM | POA: Diagnosis not present

## 2020-10-21 DIAGNOSIS — E7800 Pure hypercholesterolemia, unspecified: Secondary | ICD-10-CM | POA: Diagnosis not present

## 2020-10-21 DIAGNOSIS — E291 Testicular hypofunction: Secondary | ICD-10-CM | POA: Diagnosis not present

## 2020-11-18 DIAGNOSIS — R609 Edema, unspecified: Secondary | ICD-10-CM | POA: Diagnosis not present

## 2020-11-18 DIAGNOSIS — Z6830 Body mass index (BMI) 30.0-30.9, adult: Secondary | ICD-10-CM | POA: Diagnosis not present

## 2020-11-18 DIAGNOSIS — M545 Low back pain, unspecified: Secondary | ICD-10-CM | POA: Diagnosis not present

## 2020-11-18 DIAGNOSIS — I2510 Atherosclerotic heart disease of native coronary artery without angina pectoris: Secondary | ICD-10-CM | POA: Diagnosis not present

## 2020-11-18 DIAGNOSIS — G8929 Other chronic pain: Secondary | ICD-10-CM | POA: Diagnosis not present

## 2020-11-18 DIAGNOSIS — E291 Testicular hypofunction: Secondary | ICD-10-CM | POA: Diagnosis not present

## 2020-11-18 DIAGNOSIS — I10 Essential (primary) hypertension: Secondary | ICD-10-CM | POA: Diagnosis not present

## 2020-11-18 DIAGNOSIS — E7800 Pure hypercholesterolemia, unspecified: Secondary | ICD-10-CM | POA: Diagnosis not present

## 2020-11-18 DIAGNOSIS — E669 Obesity, unspecified: Secondary | ICD-10-CM | POA: Diagnosis not present

## 2020-11-18 DIAGNOSIS — M159 Polyosteoarthritis, unspecified: Secondary | ICD-10-CM | POA: Diagnosis not present

## 2020-11-18 DIAGNOSIS — R739 Hyperglycemia, unspecified: Secondary | ICD-10-CM | POA: Diagnosis not present

## 2020-12-16 DIAGNOSIS — G8929 Other chronic pain: Secondary | ICD-10-CM | POA: Diagnosis not present

## 2020-12-16 DIAGNOSIS — E7800 Pure hypercholesterolemia, unspecified: Secondary | ICD-10-CM | POA: Diagnosis not present

## 2020-12-16 DIAGNOSIS — R609 Edema, unspecified: Secondary | ICD-10-CM | POA: Diagnosis not present

## 2020-12-16 DIAGNOSIS — M159 Polyosteoarthritis, unspecified: Secondary | ICD-10-CM | POA: Diagnosis not present

## 2020-12-16 DIAGNOSIS — E669 Obesity, unspecified: Secondary | ICD-10-CM | POA: Diagnosis not present

## 2020-12-16 DIAGNOSIS — E291 Testicular hypofunction: Secondary | ICD-10-CM | POA: Diagnosis not present

## 2020-12-16 DIAGNOSIS — Z6828 Body mass index (BMI) 28.0-28.9, adult: Secondary | ICD-10-CM | POA: Diagnosis not present

## 2020-12-16 DIAGNOSIS — I10 Essential (primary) hypertension: Secondary | ICD-10-CM | POA: Diagnosis not present

## 2020-12-16 DIAGNOSIS — M545 Low back pain, unspecified: Secondary | ICD-10-CM | POA: Diagnosis not present

## 2020-12-16 DIAGNOSIS — I2510 Atherosclerotic heart disease of native coronary artery without angina pectoris: Secondary | ICD-10-CM | POA: Diagnosis not present

## 2020-12-16 DIAGNOSIS — R7303 Prediabetes: Secondary | ICD-10-CM | POA: Diagnosis not present

## 2020-12-21 DIAGNOSIS — Z0000 Encounter for general adult medical examination without abnormal findings: Secondary | ICD-10-CM | POA: Diagnosis not present

## 2020-12-21 DIAGNOSIS — E785 Hyperlipidemia, unspecified: Secondary | ICD-10-CM | POA: Diagnosis not present

## 2020-12-21 DIAGNOSIS — Z9181 History of falling: Secondary | ICD-10-CM | POA: Diagnosis not present

## 2020-12-21 DIAGNOSIS — Z1331 Encounter for screening for depression: Secondary | ICD-10-CM | POA: Diagnosis not present

## 2021-01-13 DIAGNOSIS — R609 Edema, unspecified: Secondary | ICD-10-CM | POA: Diagnosis not present

## 2021-01-13 DIAGNOSIS — E669 Obesity, unspecified: Secondary | ICD-10-CM | POA: Diagnosis not present

## 2021-01-13 DIAGNOSIS — I2510 Atherosclerotic heart disease of native coronary artery without angina pectoris: Secondary | ICD-10-CM | POA: Diagnosis not present

## 2021-01-13 DIAGNOSIS — E291 Testicular hypofunction: Secondary | ICD-10-CM | POA: Diagnosis not present

## 2021-01-13 DIAGNOSIS — E7800 Pure hypercholesterolemia, unspecified: Secondary | ICD-10-CM | POA: Diagnosis not present

## 2021-01-13 DIAGNOSIS — R7303 Prediabetes: Secondary | ICD-10-CM | POA: Diagnosis not present

## 2021-01-13 DIAGNOSIS — M545 Low back pain, unspecified: Secondary | ICD-10-CM | POA: Diagnosis not present

## 2021-01-13 DIAGNOSIS — I10 Essential (primary) hypertension: Secondary | ICD-10-CM | POA: Diagnosis not present

## 2021-01-13 DIAGNOSIS — Z6828 Body mass index (BMI) 28.0-28.9, adult: Secondary | ICD-10-CM | POA: Diagnosis not present

## 2021-01-13 DIAGNOSIS — G8929 Other chronic pain: Secondary | ICD-10-CM | POA: Diagnosis not present

## 2021-01-13 DIAGNOSIS — M159 Polyosteoarthritis, unspecified: Secondary | ICD-10-CM | POA: Diagnosis not present

## 2021-02-11 DIAGNOSIS — E663 Overweight: Secondary | ICD-10-CM | POA: Diagnosis not present

## 2021-02-11 DIAGNOSIS — E669 Obesity, unspecified: Secondary | ICD-10-CM | POA: Diagnosis not present

## 2021-02-11 DIAGNOSIS — M159 Polyosteoarthritis, unspecified: Secondary | ICD-10-CM | POA: Diagnosis not present

## 2021-02-11 DIAGNOSIS — G8929 Other chronic pain: Secondary | ICD-10-CM | POA: Diagnosis not present

## 2021-02-11 DIAGNOSIS — M545 Low back pain, unspecified: Secondary | ICD-10-CM | POA: Diagnosis not present

## 2021-02-11 DIAGNOSIS — E291 Testicular hypofunction: Secondary | ICD-10-CM | POA: Diagnosis not present

## 2021-02-11 DIAGNOSIS — R7303 Prediabetes: Secondary | ICD-10-CM | POA: Diagnosis not present

## 2021-02-11 DIAGNOSIS — I10 Essential (primary) hypertension: Secondary | ICD-10-CM | POA: Diagnosis not present

## 2021-02-11 DIAGNOSIS — I2510 Atherosclerotic heart disease of native coronary artery without angina pectoris: Secondary | ICD-10-CM | POA: Diagnosis not present

## 2021-02-11 DIAGNOSIS — E7800 Pure hypercholesterolemia, unspecified: Secondary | ICD-10-CM | POA: Diagnosis not present

## 2021-02-11 DIAGNOSIS — R609 Edema, unspecified: Secondary | ICD-10-CM | POA: Diagnosis not present

## 2021-02-11 DIAGNOSIS — Z6828 Body mass index (BMI) 28.0-28.9, adult: Secondary | ICD-10-CM | POA: Diagnosis not present

## 2021-03-10 DIAGNOSIS — G8929 Other chronic pain: Secondary | ICD-10-CM | POA: Diagnosis not present

## 2021-03-10 DIAGNOSIS — Z6828 Body mass index (BMI) 28.0-28.9, adult: Secondary | ICD-10-CM | POA: Diagnosis not present

## 2021-03-10 DIAGNOSIS — E7800 Pure hypercholesterolemia, unspecified: Secondary | ICD-10-CM | POA: Diagnosis not present

## 2021-03-10 DIAGNOSIS — R7303 Prediabetes: Secondary | ICD-10-CM | POA: Diagnosis not present

## 2021-03-10 DIAGNOSIS — E291 Testicular hypofunction: Secondary | ICD-10-CM | POA: Diagnosis not present

## 2021-03-10 DIAGNOSIS — I10 Essential (primary) hypertension: Secondary | ICD-10-CM | POA: Diagnosis not present

## 2021-03-10 DIAGNOSIS — M545 Low back pain, unspecified: Secondary | ICD-10-CM | POA: Diagnosis not present

## 2021-03-10 DIAGNOSIS — I2510 Atherosclerotic heart disease of native coronary artery without angina pectoris: Secondary | ICD-10-CM | POA: Diagnosis not present

## 2021-03-10 DIAGNOSIS — M159 Polyosteoarthritis, unspecified: Secondary | ICD-10-CM | POA: Diagnosis not present

## 2021-03-10 DIAGNOSIS — R609 Edema, unspecified: Secondary | ICD-10-CM | POA: Diagnosis not present

## 2021-04-07 DIAGNOSIS — G8929 Other chronic pain: Secondary | ICD-10-CM | POA: Diagnosis not present

## 2021-04-07 DIAGNOSIS — E291 Testicular hypofunction: Secondary | ICD-10-CM | POA: Diagnosis not present

## 2021-04-07 DIAGNOSIS — R7303 Prediabetes: Secondary | ICD-10-CM | POA: Diagnosis not present

## 2021-04-07 DIAGNOSIS — M159 Polyosteoarthritis, unspecified: Secondary | ICD-10-CM | POA: Diagnosis not present

## 2021-04-07 DIAGNOSIS — E7800 Pure hypercholesterolemia, unspecified: Secondary | ICD-10-CM | POA: Diagnosis not present

## 2021-04-07 DIAGNOSIS — M545 Low back pain, unspecified: Secondary | ICD-10-CM | POA: Diagnosis not present

## 2021-04-07 DIAGNOSIS — R609 Edema, unspecified: Secondary | ICD-10-CM | POA: Diagnosis not present

## 2021-04-07 DIAGNOSIS — I10 Essential (primary) hypertension: Secondary | ICD-10-CM | POA: Diagnosis not present

## 2021-04-07 DIAGNOSIS — Z6828 Body mass index (BMI) 28.0-28.9, adult: Secondary | ICD-10-CM | POA: Diagnosis not present

## 2021-04-07 DIAGNOSIS — I2510 Atherosclerotic heart disease of native coronary artery without angina pectoris: Secondary | ICD-10-CM | POA: Diagnosis not present

## 2021-04-19 ENCOUNTER — Other Ambulatory Visit: Payer: Self-pay

## 2021-04-19 ENCOUNTER — Ambulatory Visit: Payer: PPO | Admitting: Cardiology

## 2021-04-19 ENCOUNTER — Encounter: Payer: Self-pay | Admitting: Cardiology

## 2021-04-19 VITALS — BP 104/54 | HR 76 | Ht 65.0 in | Wt 193.0 lb

## 2021-04-19 DIAGNOSIS — I10 Essential (primary) hypertension: Secondary | ICD-10-CM | POA: Diagnosis not present

## 2021-04-19 DIAGNOSIS — E782 Mixed hyperlipidemia: Secondary | ICD-10-CM | POA: Diagnosis not present

## 2021-04-19 DIAGNOSIS — I2510 Atherosclerotic heart disease of native coronary artery without angina pectoris: Secondary | ICD-10-CM | POA: Diagnosis not present

## 2021-04-19 DIAGNOSIS — E119 Type 2 diabetes mellitus without complications: Secondary | ICD-10-CM | POA: Diagnosis not present

## 2021-04-19 DIAGNOSIS — Z951 Presence of aortocoronary bypass graft: Secondary | ICD-10-CM | POA: Diagnosis not present

## 2021-04-19 NOTE — Progress Notes (Signed)
?Cardiology Office Note:   ? ?Date:  04/19/2021  ? ?ID:  Eddie Nelson, DOB [birth year], MRN [id number] ? ?PCP:  Simone Curia, MD  ?Cardiologist:  Garwin Brothers, MD  ? ?Referring MD: Simone Curia, MD  ? ? ?ASSESSMENT:   ? ?1. Coronary artery disease involving native coronary artery of native heart without angina pectoris   ?2. Essential hypertension   ?3. Mixed dyslipidemia   ?4. S/P CABG x 3   ?5. Diet-controlled diabetes mellitus (HCC)   ? ?PLAN:   ? ?In order of problems listed above: ? ?Coronary artery disease post CABG surgery: Secondary prevention stressed with the patient.  Importance of compliance with diet medication stressed and he vocalized understanding.  He was advised to walk at least half an hour a day 5 days a week and he promises to do so. ?Essential hypertension: Blood pressure stable and diet was emphasized.  Lifestyle modification urged.  He is doing good with exercise. ?Mixed dyslipidemia: Lipids were reviewed.  He has significant hypertriglyceridemia.  Importance was given an emphasis was placed on reducing carbs and fried foods in the diet and he promises to do better.  He is taking his medications regularly.  Lipids followed by primary care. ?Weight reduction was also stressed.Patient will be seen in follow-up appointment in 6 months or earlier if the patient has any concerns ? ? ? ?Medication Adjustments/Labs and Tests Ordered: ?Current medicines are reviewed at length with the patient today.  Concerns regarding medicines are outlined above.  ?No orders of the defined types were placed in this encounter. ? ?No orders of the defined types were placed in this encounter. ? ? ? ?No chief complaint on file. ?  ? ?History of Present Illness:   ? ?Eddie Nelson is a 66 y.o. male.  Patient has past medical history of coronary artery disease post CABG surgery, essential hypertension, dyslipidemia and diet-controlled diabetes mellitus.  He denies any problems at this time and takes care of  activities of daily living.  No chest pain orthopnea or PND.  At the time of my evaluation, the patient is alert awake oriented and in no distress. ? ?Past Medical History:  ?Diagnosis Date  ? CAD (coronary artery disease), native coronary artery 04/11/2018  ? Chronic lumbar pain 04/10/2018  ? Diet-controlled diabetes mellitus (HCC) 04/10/2018  ? Essential hypertension 04/10/2018  ? Hypertension   ? Mixed dyslipidemia 04/10/2018  ? Osteoarthritis   ? S/P CABG x 3 04/15/2018  ? ? ?Past Surgical History:  ?Procedure Laterality Date  ? CORONARY ARTERY BYPASS GRAFT N/A 04/15/2018  ? Procedure: CORONARY ARTERY BYPASS GRAFTING (CABG), ON PUMP, TIMES THREE, USING LEFT INTERNAL MAMMARY ARTERY  AND ENDOSCOPICALLY HARVESTED LEFT GREATER SAPHENOUS VEIN;  Surgeon: Kerin Perna, MD;  Location: [facility] OR;  Service: Open Heart Surgery;  Laterality: N/A;  ? LEFT HEART CATH AND CORONARY ANGIOGRAPHY N/A 04/11/2018  ? Procedure: LEFT HEART CATH AND CORONARY ANGIOGRAPHY;  Surgeon: Lennette Bihari, MD;  Location: [facility] INVASIVE CV LAB;  Service: Cardiovascular;  Laterality: N/A;  ? TEE WITHOUT CARDIOVERSION N/A 04/15/2018  ? Procedure: TRANSESOPHAGEAL ECHOCARDIOGRAM (TEE);  Surgeon: Donata Clay, Theron Arista, MD;  Location: [facility] OR;  Service: Open Heart Surgery;  Laterality: N/A;  ? ? ?Current Medications: ?Current Meds  ?Medication Sig  ? aspirin 81 MG EC tablet Take 81 mg by mouth daily.  ? atorvastatin (LIPITOR) 80 MG tablet Take 1 tablet (80 mg total) by mouth daily at 6 PM.  ? ezetimibe (ZETIA)  10 MG tablet Take 10 mg by mouth daily.  ? furosemide (LASIX) 20 MG tablet Take 20 mg by mouth daily.  ? lisinopril (PRINIVIL,ZESTRIL) 10 MG tablet Take 1 tablet by mouth daily.  ? metoprolol succinate (TOPROL-XL) 25 MG 24 hr tablet Take 25 mg by mouth daily.  ? nitroGLYCERIN (NITROSTAT) 0.4 MG SL tablet Place 0.4 mg under the tongue every 5 (five) minutes as needed for chest pain.  ? oxyCODONE-acetaminophen (PERCOCET/ROXICET) 5-325 MG tablet Take 1 tablet by mouth  every 6 (six) hours as needed for pain.  ? VASCEPA 1 g capsule Take 2 g by mouth 2 (two) times daily.  ?  ? ?Allergies:   Tramadol  ? ?Social History  ? ?Socioeconomic History  ? Marital status: Single  ?  Spouse name: Not on file  ? Number of children: Not on file  ? Years of education: Not on file  ? Highest education level: Not on file  ?Occupational History  ? Not on file  ?Tobacco Use  ? Smoking status: Former  ? Smokeless tobacco: Never  ?Substance and Sexual Activity  ? Alcohol use: Not on file  ? Drug use: Not on file  ? Sexual activity: Not on file  ?Other Topics Concern  ? Not on file  ?Social History Narrative  ? Not on file  ? ?Social Determinants of Health  ? ?Financial Resource Strain: Not on file  ?Food Insecurity: Not on file  ?Transportation Needs: Not on file  ?Physical Activity: Not on file  ?Stress: Not on file  ?Social Connections: Not on file  ?  ? ?Family History: ?The patient's family history includes Heart disease in his father; Hodgkin's lymphoma in his sister; Hyperlipidemia in his brother. ? ?ROS:   ?Please see the history of present illness.    ?All other systems reviewed and are negative. ? ?EKGs/Labs/Other Studies Reviewed:   ? ?The following studies were reviewed today: ?EKG reveals sinus rhythm and nonspecific ST-T changes ? ? ?Recent Labs: ?No results found for requested labs within last 8760 hours.  ?Recent Lipid Panel ?   ?Component Value Date/Time  ? CHOL 301 (H) 04/10/2018 1223  ? TRIG 114 04/10/2018 1223  ? HDL 48 04/10/2018 1223  ? CHOLHDL 6.3 (H) 04/10/2018 1223  ? LDLCALC 230 (H) 04/10/2018 1223  ? ? ?Physical Exam:   ? ?VS:  BP (!) 104/54   Pulse 76   Ht 5\' 5"  (1.651 m)   Wt 193 lb (87.5 kg)   SpO2 95%   BMI 32.12 kg/m?    ? ?Wt Readings from Last 3 Encounters:  ?04/19/21 193 lb (87.5 kg)  ?10/12/20 198 lb (89.8 kg)  ?02/17/20 209 lb 9.6 oz (95.1 kg)  ?  ? ?GEN: Patient is in no acute distress ?HEENT: Normal ?NECK: No JVD; No carotid bruits ?LYMPHATICS: No  lymphadenopathy ?CARDIAC: Hear sounds regular, 2/6 systolic murmur at the apex. ?RESPIRATORY:  Clear to auscultation without rales, wheezing or rhonchi  ?ABDOMEN: Soft, non-tender, non-distended ?MUSCULOSKELETAL:  No edema; No deformity  ?SKIN: Warm and dry ?NEUROLOGIC:  Alert and oriented x 3 ?PSYCHIATRIC:  Normal affect  ? ?Signed, ?04/16/20, MD  ?04/19/2021 2:59 PM    ?[facility]  ?

## 2021-04-19 NOTE — Patient Instructions (Signed)

## 2021-05-05 DIAGNOSIS — M545 Low back pain, unspecified: Secondary | ICD-10-CM | POA: Diagnosis not present

## 2021-05-05 DIAGNOSIS — E7800 Pure hypercholesterolemia, unspecified: Secondary | ICD-10-CM | POA: Diagnosis not present

## 2021-05-05 DIAGNOSIS — Z6828 Body mass index (BMI) 28.0-28.9, adult: Secondary | ICD-10-CM | POA: Diagnosis not present

## 2021-05-05 DIAGNOSIS — E291 Testicular hypofunction: Secondary | ICD-10-CM | POA: Diagnosis not present

## 2021-05-05 DIAGNOSIS — I2510 Atherosclerotic heart disease of native coronary artery without angina pectoris: Secondary | ICD-10-CM | POA: Diagnosis not present

## 2021-05-05 DIAGNOSIS — R7303 Prediabetes: Secondary | ICD-10-CM | POA: Diagnosis not present

## 2021-05-05 DIAGNOSIS — R609 Edema, unspecified: Secondary | ICD-10-CM | POA: Diagnosis not present

## 2021-05-05 DIAGNOSIS — J069 Acute upper respiratory infection, unspecified: Secondary | ICD-10-CM | POA: Diagnosis not present

## 2021-05-05 DIAGNOSIS — M159 Polyosteoarthritis, unspecified: Secondary | ICD-10-CM | POA: Diagnosis not present

## 2021-05-05 DIAGNOSIS — G8929 Other chronic pain: Secondary | ICD-10-CM | POA: Diagnosis not present

## 2021-05-05 DIAGNOSIS — I10 Essential (primary) hypertension: Secondary | ICD-10-CM | POA: Diagnosis not present

## 2021-06-02 DIAGNOSIS — E7800 Pure hypercholesterolemia, unspecified: Secondary | ICD-10-CM | POA: Diagnosis not present

## 2021-06-02 DIAGNOSIS — G8929 Other chronic pain: Secondary | ICD-10-CM | POA: Diagnosis not present

## 2021-06-02 DIAGNOSIS — E669 Obesity, unspecified: Secondary | ICD-10-CM | POA: Diagnosis not present

## 2021-06-02 DIAGNOSIS — I10 Essential (primary) hypertension: Secondary | ICD-10-CM | POA: Diagnosis not present

## 2021-06-02 DIAGNOSIS — I2510 Atherosclerotic heart disease of native coronary artery without angina pectoris: Secondary | ICD-10-CM | POA: Diagnosis not present

## 2021-06-02 DIAGNOSIS — R7303 Prediabetes: Secondary | ICD-10-CM | POA: Diagnosis not present

## 2021-06-02 DIAGNOSIS — M159 Polyosteoarthritis, unspecified: Secondary | ICD-10-CM | POA: Diagnosis not present

## 2021-06-02 DIAGNOSIS — M545 Low back pain, unspecified: Secondary | ICD-10-CM | POA: Diagnosis not present

## 2021-06-02 DIAGNOSIS — Z6829 Body mass index (BMI) 29.0-29.9, adult: Secondary | ICD-10-CM | POA: Diagnosis not present

## 2021-06-30 DIAGNOSIS — G8929 Other chronic pain: Secondary | ICD-10-CM | POA: Diagnosis not present

## 2021-06-30 DIAGNOSIS — M159 Polyosteoarthritis, unspecified: Secondary | ICD-10-CM | POA: Diagnosis not present

## 2021-06-30 DIAGNOSIS — M545 Low back pain, unspecified: Secondary | ICD-10-CM | POA: Diagnosis not present

## 2021-06-30 DIAGNOSIS — I2510 Atherosclerotic heart disease of native coronary artery without angina pectoris: Secondary | ICD-10-CM | POA: Diagnosis not present

## 2021-06-30 DIAGNOSIS — E7800 Pure hypercholesterolemia, unspecified: Secondary | ICD-10-CM | POA: Diagnosis not present

## 2021-06-30 DIAGNOSIS — E291 Testicular hypofunction: Secondary | ICD-10-CM | POA: Diagnosis not present

## 2021-06-30 DIAGNOSIS — R7303 Prediabetes: Secondary | ICD-10-CM | POA: Diagnosis not present

## 2021-06-30 DIAGNOSIS — Z6829 Body mass index (BMI) 29.0-29.9, adult: Secondary | ICD-10-CM | POA: Diagnosis not present

## 2021-06-30 DIAGNOSIS — I10 Essential (primary) hypertension: Secondary | ICD-10-CM | POA: Diagnosis not present

## 2021-06-30 DIAGNOSIS — R609 Edema, unspecified: Secondary | ICD-10-CM | POA: Diagnosis not present

## 2021-07-28 DIAGNOSIS — G8929 Other chronic pain: Secondary | ICD-10-CM | POA: Diagnosis not present

## 2021-07-28 DIAGNOSIS — M545 Low back pain, unspecified: Secondary | ICD-10-CM | POA: Diagnosis not present

## 2021-07-28 DIAGNOSIS — R609 Edema, unspecified: Secondary | ICD-10-CM | POA: Diagnosis not present

## 2021-07-28 DIAGNOSIS — I10 Essential (primary) hypertension: Secondary | ICD-10-CM | POA: Diagnosis not present

## 2021-07-28 DIAGNOSIS — M159 Polyosteoarthritis, unspecified: Secondary | ICD-10-CM | POA: Diagnosis not present

## 2021-07-28 DIAGNOSIS — Z6829 Body mass index (BMI) 29.0-29.9, adult: Secondary | ICD-10-CM | POA: Diagnosis not present

## 2021-07-28 DIAGNOSIS — R7303 Prediabetes: Secondary | ICD-10-CM | POA: Diagnosis not present

## 2021-07-28 DIAGNOSIS — I2510 Atherosclerotic heart disease of native coronary artery without angina pectoris: Secondary | ICD-10-CM | POA: Diagnosis not present

## 2021-07-28 DIAGNOSIS — E291 Testicular hypofunction: Secondary | ICD-10-CM | POA: Diagnosis not present

## 2021-07-28 DIAGNOSIS — E7800 Pure hypercholesterolemia, unspecified: Secondary | ICD-10-CM | POA: Diagnosis not present

## 2021-07-28 DIAGNOSIS — J309 Allergic rhinitis, unspecified: Secondary | ICD-10-CM | POA: Diagnosis not present

## 2021-08-25 DIAGNOSIS — J309 Allergic rhinitis, unspecified: Secondary | ICD-10-CM | POA: Diagnosis not present

## 2021-08-25 DIAGNOSIS — R7303 Prediabetes: Secondary | ICD-10-CM | POA: Diagnosis not present

## 2021-08-25 DIAGNOSIS — G8929 Other chronic pain: Secondary | ICD-10-CM | POA: Diagnosis not present

## 2021-08-25 DIAGNOSIS — M159 Polyosteoarthritis, unspecified: Secondary | ICD-10-CM | POA: Diagnosis not present

## 2021-08-25 DIAGNOSIS — R609 Edema, unspecified: Secondary | ICD-10-CM | POA: Diagnosis not present

## 2021-08-25 DIAGNOSIS — M545 Low back pain, unspecified: Secondary | ICD-10-CM | POA: Diagnosis not present

## 2021-08-25 DIAGNOSIS — E291 Testicular hypofunction: Secondary | ICD-10-CM | POA: Diagnosis not present

## 2021-08-25 DIAGNOSIS — Z6829 Body mass index (BMI) 29.0-29.9, adult: Secondary | ICD-10-CM | POA: Diagnosis not present

## 2021-08-25 DIAGNOSIS — E7800 Pure hypercholesterolemia, unspecified: Secondary | ICD-10-CM | POA: Diagnosis not present

## 2021-08-25 DIAGNOSIS — Z139 Encounter for screening, unspecified: Secondary | ICD-10-CM | POA: Diagnosis not present

## 2021-08-25 DIAGNOSIS — I2510 Atherosclerotic heart disease of native coronary artery without angina pectoris: Secondary | ICD-10-CM | POA: Diagnosis not present

## 2021-08-25 DIAGNOSIS — I10 Essential (primary) hypertension: Secondary | ICD-10-CM | POA: Diagnosis not present

## 2021-09-22 DIAGNOSIS — R609 Edema, unspecified: Secondary | ICD-10-CM | POA: Diagnosis not present

## 2021-09-22 DIAGNOSIS — R7303 Prediabetes: Secondary | ICD-10-CM | POA: Diagnosis not present

## 2021-09-22 DIAGNOSIS — Z6830 Body mass index (BMI) 30.0-30.9, adult: Secondary | ICD-10-CM | POA: Diagnosis not present

## 2021-09-22 DIAGNOSIS — M159 Polyosteoarthritis, unspecified: Secondary | ICD-10-CM | POA: Diagnosis not present

## 2021-09-22 DIAGNOSIS — E291 Testicular hypofunction: Secondary | ICD-10-CM | POA: Diagnosis not present

## 2021-09-22 DIAGNOSIS — I2510 Atherosclerotic heart disease of native coronary artery without angina pectoris: Secondary | ICD-10-CM | POA: Diagnosis not present

## 2021-09-22 DIAGNOSIS — J309 Allergic rhinitis, unspecified: Secondary | ICD-10-CM | POA: Diagnosis not present

## 2021-09-22 DIAGNOSIS — Z139 Encounter for screening, unspecified: Secondary | ICD-10-CM | POA: Diagnosis not present

## 2021-09-22 DIAGNOSIS — I10 Essential (primary) hypertension: Secondary | ICD-10-CM | POA: Diagnosis not present

## 2021-09-22 DIAGNOSIS — E7800 Pure hypercholesterolemia, unspecified: Secondary | ICD-10-CM | POA: Diagnosis not present

## 2021-09-22 DIAGNOSIS — G8929 Other chronic pain: Secondary | ICD-10-CM | POA: Diagnosis not present

## 2021-09-22 DIAGNOSIS — M545 Low back pain, unspecified: Secondary | ICD-10-CM | POA: Diagnosis not present

## 2021-10-20 DIAGNOSIS — I10 Essential (primary) hypertension: Secondary | ICD-10-CM | POA: Diagnosis not present

## 2021-10-20 DIAGNOSIS — Z6830 Body mass index (BMI) 30.0-30.9, adult: Secondary | ICD-10-CM | POA: Diagnosis not present

## 2021-10-20 DIAGNOSIS — M545 Low back pain, unspecified: Secondary | ICD-10-CM | POA: Diagnosis not present

## 2021-10-20 DIAGNOSIS — E291 Testicular hypofunction: Secondary | ICD-10-CM | POA: Diagnosis not present

## 2021-10-20 DIAGNOSIS — R5382 Chronic fatigue, unspecified: Secondary | ICD-10-CM | POA: Diagnosis not present

## 2021-10-20 DIAGNOSIS — J309 Allergic rhinitis, unspecified: Secondary | ICD-10-CM | POA: Diagnosis not present

## 2021-10-20 DIAGNOSIS — I2510 Atherosclerotic heart disease of native coronary artery without angina pectoris: Secondary | ICD-10-CM | POA: Diagnosis not present

## 2021-10-20 DIAGNOSIS — R7303 Prediabetes: Secondary | ICD-10-CM | POA: Diagnosis not present

## 2021-10-20 DIAGNOSIS — G8929 Other chronic pain: Secondary | ICD-10-CM | POA: Diagnosis not present

## 2021-10-20 DIAGNOSIS — R609 Edema, unspecified: Secondary | ICD-10-CM | POA: Diagnosis not present

## 2021-10-20 DIAGNOSIS — M159 Polyosteoarthritis, unspecified: Secondary | ICD-10-CM | POA: Diagnosis not present

## 2021-10-20 DIAGNOSIS — E7800 Pure hypercholesterolemia, unspecified: Secondary | ICD-10-CM | POA: Diagnosis not present

## 2021-11-07 DIAGNOSIS — Z125 Encounter for screening for malignant neoplasm of prostate: Secondary | ICD-10-CM | POA: Diagnosis not present

## 2021-11-07 DIAGNOSIS — R7303 Prediabetes: Secondary | ICD-10-CM | POA: Diagnosis not present

## 2021-11-07 DIAGNOSIS — I10 Essential (primary) hypertension: Secondary | ICD-10-CM | POA: Diagnosis not present

## 2021-11-07 DIAGNOSIS — I2510 Atherosclerotic heart disease of native coronary artery without angina pectoris: Secondary | ICD-10-CM | POA: Diagnosis not present

## 2021-11-07 DIAGNOSIS — Z1212 Encounter for screening for malignant neoplasm of rectum: Secondary | ICD-10-CM | POA: Diagnosis not present

## 2021-11-07 DIAGNOSIS — R609 Edema, unspecified: Secondary | ICD-10-CM | POA: Diagnosis not present

## 2021-11-07 DIAGNOSIS — M159 Polyosteoarthritis, unspecified: Secondary | ICD-10-CM | POA: Diagnosis not present

## 2021-11-07 DIAGNOSIS — J309 Allergic rhinitis, unspecified: Secondary | ICD-10-CM | POA: Diagnosis not present

## 2021-11-07 DIAGNOSIS — E291 Testicular hypofunction: Secondary | ICD-10-CM | POA: Diagnosis not present

## 2021-11-07 DIAGNOSIS — Z0000 Encounter for general adult medical examination without abnormal findings: Secondary | ICD-10-CM | POA: Diagnosis not present

## 2021-11-07 DIAGNOSIS — M545 Low back pain, unspecified: Secondary | ICD-10-CM | POA: Diagnosis not present

## 2021-11-07 DIAGNOSIS — E7800 Pure hypercholesterolemia, unspecified: Secondary | ICD-10-CM | POA: Diagnosis not present

## 2021-11-07 DIAGNOSIS — G8929 Other chronic pain: Secondary | ICD-10-CM | POA: Diagnosis not present

## 2021-11-17 DIAGNOSIS — G8929 Other chronic pain: Secondary | ICD-10-CM | POA: Diagnosis not present

## 2021-11-17 DIAGNOSIS — R609 Edema, unspecified: Secondary | ICD-10-CM | POA: Diagnosis not present

## 2021-11-17 DIAGNOSIS — M545 Low back pain, unspecified: Secondary | ICD-10-CM | POA: Diagnosis not present

## 2021-11-17 DIAGNOSIS — M159 Polyosteoarthritis, unspecified: Secondary | ICD-10-CM | POA: Diagnosis not present

## 2021-11-17 DIAGNOSIS — I2510 Atherosclerotic heart disease of native coronary artery without angina pectoris: Secondary | ICD-10-CM | POA: Diagnosis not present

## 2021-11-17 DIAGNOSIS — E291 Testicular hypofunction: Secondary | ICD-10-CM | POA: Diagnosis not present

## 2021-11-17 DIAGNOSIS — J309 Allergic rhinitis, unspecified: Secondary | ICD-10-CM | POA: Diagnosis not present

## 2021-11-17 DIAGNOSIS — R7303 Prediabetes: Secondary | ICD-10-CM | POA: Diagnosis not present

## 2021-11-17 DIAGNOSIS — I10 Essential (primary) hypertension: Secondary | ICD-10-CM | POA: Diagnosis not present

## 2021-11-17 DIAGNOSIS — Z6830 Body mass index (BMI) 30.0-30.9, adult: Secondary | ICD-10-CM | POA: Diagnosis not present

## 2021-11-17 DIAGNOSIS — E7800 Pure hypercholesterolemia, unspecified: Secondary | ICD-10-CM | POA: Diagnosis not present

## 2021-12-15 DIAGNOSIS — E7800 Pure hypercholesterolemia, unspecified: Secondary | ICD-10-CM | POA: Diagnosis not present

## 2021-12-15 DIAGNOSIS — E291 Testicular hypofunction: Secondary | ICD-10-CM | POA: Diagnosis not present

## 2021-12-15 DIAGNOSIS — J309 Allergic rhinitis, unspecified: Secondary | ICD-10-CM | POA: Diagnosis not present

## 2021-12-15 DIAGNOSIS — R609 Edema, unspecified: Secondary | ICD-10-CM | POA: Diagnosis not present

## 2021-12-15 DIAGNOSIS — R7303 Prediabetes: Secondary | ICD-10-CM | POA: Diagnosis not present

## 2021-12-15 DIAGNOSIS — M159 Polyosteoarthritis, unspecified: Secondary | ICD-10-CM | POA: Diagnosis not present

## 2021-12-15 DIAGNOSIS — I10 Essential (primary) hypertension: Secondary | ICD-10-CM | POA: Diagnosis not present

## 2021-12-15 DIAGNOSIS — G8929 Other chronic pain: Secondary | ICD-10-CM | POA: Diagnosis not present

## 2021-12-15 DIAGNOSIS — M545 Low back pain, unspecified: Secondary | ICD-10-CM | POA: Diagnosis not present

## 2021-12-15 DIAGNOSIS — I2510 Atherosclerotic heart disease of native coronary artery without angina pectoris: Secondary | ICD-10-CM | POA: Diagnosis not present

## 2021-12-15 DIAGNOSIS — Z6830 Body mass index (BMI) 30.0-30.9, adult: Secondary | ICD-10-CM | POA: Diagnosis not present

## 2022-01-12 DIAGNOSIS — I2510 Atherosclerotic heart disease of native coronary artery without angina pectoris: Secondary | ICD-10-CM | POA: Diagnosis not present

## 2022-01-12 DIAGNOSIS — E291 Testicular hypofunction: Secondary | ICD-10-CM | POA: Diagnosis not present

## 2022-01-12 DIAGNOSIS — J309 Allergic rhinitis, unspecified: Secondary | ICD-10-CM | POA: Diagnosis not present

## 2022-01-12 DIAGNOSIS — G8929 Other chronic pain: Secondary | ICD-10-CM | POA: Diagnosis not present

## 2022-01-12 DIAGNOSIS — R7303 Prediabetes: Secondary | ICD-10-CM | POA: Diagnosis not present

## 2022-01-12 DIAGNOSIS — M159 Polyosteoarthritis, unspecified: Secondary | ICD-10-CM | POA: Diagnosis not present

## 2022-01-12 DIAGNOSIS — M545 Low back pain, unspecified: Secondary | ICD-10-CM | POA: Diagnosis not present

## 2022-01-12 DIAGNOSIS — R609 Edema, unspecified: Secondary | ICD-10-CM | POA: Diagnosis not present

## 2022-01-12 DIAGNOSIS — Z6831 Body mass index (BMI) 31.0-31.9, adult: Secondary | ICD-10-CM | POA: Diagnosis not present

## 2022-01-12 DIAGNOSIS — E7800 Pure hypercholesterolemia, unspecified: Secondary | ICD-10-CM | POA: Diagnosis not present

## 2022-01-12 DIAGNOSIS — I10 Essential (primary) hypertension: Secondary | ICD-10-CM | POA: Diagnosis not present

## 2022-02-09 DIAGNOSIS — I10 Essential (primary) hypertension: Secondary | ICD-10-CM | POA: Diagnosis not present

## 2022-02-09 DIAGNOSIS — Z6831 Body mass index (BMI) 31.0-31.9, adult: Secondary | ICD-10-CM | POA: Diagnosis not present

## 2022-02-09 DIAGNOSIS — R7303 Prediabetes: Secondary | ICD-10-CM | POA: Diagnosis not present

## 2022-02-09 DIAGNOSIS — E291 Testicular hypofunction: Secondary | ICD-10-CM | POA: Diagnosis not present

## 2022-02-09 DIAGNOSIS — J309 Allergic rhinitis, unspecified: Secondary | ICD-10-CM | POA: Diagnosis not present

## 2022-02-09 DIAGNOSIS — E7800 Pure hypercholesterolemia, unspecified: Secondary | ICD-10-CM | POA: Diagnosis not present

## 2022-02-09 DIAGNOSIS — I2510 Atherosclerotic heart disease of native coronary artery without angina pectoris: Secondary | ICD-10-CM | POA: Diagnosis not present

## 2022-02-09 DIAGNOSIS — R609 Edema, unspecified: Secondary | ICD-10-CM | POA: Diagnosis not present

## 2022-02-09 DIAGNOSIS — G8929 Other chronic pain: Secondary | ICD-10-CM | POA: Diagnosis not present

## 2022-02-09 DIAGNOSIS — M545 Low back pain, unspecified: Secondary | ICD-10-CM | POA: Diagnosis not present

## 2022-02-09 DIAGNOSIS — M159 Polyosteoarthritis, unspecified: Secondary | ICD-10-CM | POA: Diagnosis not present

## 2022-02-16 ENCOUNTER — Ambulatory Visit: Payer: PPO | Attending: Cardiology | Admitting: Cardiology

## 2022-02-16 ENCOUNTER — Encounter: Payer: Self-pay | Admitting: Cardiology

## 2022-02-16 VITALS — BP 128/66 | HR 58 | Ht 69.0 in | Wt 211.4 lb

## 2022-02-16 DIAGNOSIS — E782 Mixed hyperlipidemia: Secondary | ICD-10-CM | POA: Diagnosis not present

## 2022-02-16 DIAGNOSIS — E119 Type 2 diabetes mellitus without complications: Secondary | ICD-10-CM | POA: Diagnosis not present

## 2022-02-16 DIAGNOSIS — I10 Essential (primary) hypertension: Secondary | ICD-10-CM

## 2022-02-16 DIAGNOSIS — Z951 Presence of aortocoronary bypass graft: Secondary | ICD-10-CM

## 2022-02-16 DIAGNOSIS — I2510 Atherosclerotic heart disease of native coronary artery without angina pectoris: Secondary | ICD-10-CM | POA: Diagnosis not present

## 2022-02-16 MED ORDER — FENOFIBRATE 160 MG PO TABS: 160.0000 mg | ORAL_TABLET | Freq: Every day | ORAL | 3 refills | Status: DC

## 2022-02-16 NOTE — Patient Instructions (Signed)
Medication Instructions:  Your physician has recommended you make the following change in your medication:   Start Fenofibrate 160 mg daily.  *If you need a refill on your cardiac medications before your next appointment, please call your pharmacy*   Lab Work: Your physician recommends that you return for lab work in: 6 weeks You need to have labs done when you are fasting.  You can come Monday through Friday 8:30 am to 12:00 pm and 1:15 to 4:30. You do not need to make an appointment as the order has already been placed. The labs you are going to have done are CMET and Lipids.  If you have labs (blood work) drawn today and your tests are completely normal, you will receive your results only by: [place] (if you have MyChart) OR A paper copy in the mail If you have any lab test that is abnormal or we need to change your treatment, we will call you to review the results.   Testing/Procedures: None ordered   Follow-Up: At [facility], you and your health needs are our priority.  As part of our continuing mission to provide you with exceptional heart care, we have created designated Provider Care Teams.  These Care Teams include your primary Cardiologist (physician) and Advanced Practice Providers (APPs -  Physician Assistants and Nurse Practitioners) who all work together to provide you with the care you need, when you need it.  We recommend signing up for the patient portal called "MyChart".  Sign up information is provided on this After Visit Summary.  MyChart is used to connect with patients for Virtual Visits (Telemedicine).  Patients are able to view lab/test results, encounter notes, upcoming appointments, etc.  Non-urgent messages can be sent to your provider as well.   To learn more about what you can do with MyChart, go to [email or web address].    Your next appointment:   6 month(s)  The format for your next appointment:   In Person  Provider:   Jyl Heinz, MD    Other Instructions none  Important Information About Sugar

## 2022-02-16 NOTE — Progress Notes (Signed)
Cardiology Office Note:    Date:  02/16/2022   ID:  Eddie Nelson, DOB 1955/11/14, MRN [id number]  PCP:  Cher Nakai, MD  Cardiologist:  Jenean Lindau, MD   Referring MD: Cher Nakai, MD    ASSESSMENT:    1. Coronary artery disease involving native coronary artery of native heart without angina pectoris   2. Essential hypertension   3. Diet-controlled diabetes mellitus ([place])   4. Mixed dyslipidemia   5. S/P CABG x 3    PLAN:    In order of problems listed above:  Coronary artery disease post CABG surgery: Secondary prevention stressed with the patient.  Importance of compliance with diet medication stressed and vocalized understanding.  He was advised to walk at least half an hour a day 5 days a week and he promises to do so. Essential hypertension: Blood pressure is stable and diet was emphasized. Mixed dyslipidemia: On lipid-lowering medications followed by primary care.  I reviewed lipids.  I have added fenofibrate to his regimen.  He has been lax with his diet and he promises to do better.  He will be back in 6 weeks for liver lipid check. Diabetes mellitus diet control and obesity: Weight reduction stressed lifestyle modification urged and he promises to change and do better. Patient will be seen in follow-up appointment in 6 months or earlier if the patient has any concerns    Medication Adjustments/Labs and Tests Ordered: Current medicines are reviewed at length with the patient today.  Concerns regarding medicines are outlined above.  No orders of the defined types were placed in this encounter.  No orders of the defined types were placed in this encounter.    No chief complaint on file.    History of Present Illness:    Eddie Nelson is a 67 y.o. male.  Patient has past medical history of coronary artery disease post CABG surgery, essential hypertension, mixed dyslipidemia and diet-controlled diabetes mellitus.  He is an active gentleman but does not  exercise on a regular basis.  No chest pain orthopnea or PND.  At the time of my evaluation, the patient is alert awake oriented and in no distress.  Past Medical History:  Diagnosis Date   CAD (coronary artery disease), native coronary artery 04/11/2018   Chronic lumbar pain 04/10/2018   Diet-controlled diabetes mellitus ([place]) 04/10/2018   Essential hypertension 04/10/2018   Hypertension    Mixed dyslipidemia 04/10/2018   Osteoarthritis    S/P CABG x 3 04/15/2018    Past Surgical History:  Procedure Laterality Date   CORONARY ARTERY BYPASS GRAFT N/A 04/15/2018   Procedure: CORONARY ARTERY BYPASS GRAFTING (CABG), ON PUMP, TIMES THREE, USING LEFT INTERNAL MAMMARY ARTERY  AND ENDOSCOPICALLY HARVESTED LEFT GREATER SAPHENOUS VEIN;  Surgeon: Ivin Poot, MD;  Location: [facility];  Service: Open Heart Surgery;  Laterality: N/A;   LEFT HEART CATH AND CORONARY ANGIOGRAPHY N/A 04/11/2018   Procedure: LEFT HEART CATH AND CORONARY ANGIOGRAPHY;  Surgeon: Troy Sine, MD;  Location: [facility];  Service: Cardiovascular;  Laterality: N/A;   TEE WITHOUT CARDIOVERSION N/A 04/15/2018   Procedure: TRANSESOPHAGEAL ECHOCARDIOGRAM (TEE);  Surgeon: Prescott Gum, Collier [place], MD;  Location: [place];  Service: Open Heart Surgery;  Laterality: N/A;    Current Medications: Current Meds  Medication Sig   aspirin 81 MG EC tablet Take 81 mg by mouth daily.   atorvastatin (LIPITOR) 80 MG tablet Take 1 tablet (80 mg total) by mouth daily at 6  PM.   ezetimibe (ZETIA) 10 MG tablet Take 10 mg by mouth daily.   furosemide (LASIX) 20 MG tablet Take 20 mg by mouth daily.   lisinopril (PRINIVIL,ZESTRIL) 10 MG tablet Take 1 tablet by mouth daily.   metoprolol succinate (TOPROL-XL) 25 MG 24 hr tablet Take 25 mg by mouth daily.   nitroGLYCERIN (NITROSTAT) 0.4 MG SL tablet Place 0.4 mg under the tongue every 5 (five) minutes as needed for chest pain.   oxyCODONE-acetaminophen (PERCOCET/ROXICET) 5-325 MG tablet Take 1 tablet by mouth  every 6 (six) hours as needed for pain.   traMADol (ULTRAM) 50 MG tablet Take 100 mg by mouth at bedtime.   VASCEPA 1 g capsule Take 2 g by mouth 2 (two) times daily.     Allergies:   Patient has no known allergies.   Social History   Socioeconomic History   Marital status: Single    Spouse name: Not on file   Number of children: Not on file   Years of education: Not on file   Highest education level: Not on file  Occupational History   Not on file  Tobacco Use   Smoking status: Former   Smokeless tobacco: Never  Substance and Sexual Activity   Alcohol use: Not on file   Drug use: Not on file   Sexual activity: Not on file  Other Topics Concern   Not on file  Social History Narrative   Not on file   Social Determinants of Health   Financial Resource Strain: Not on file  Food Insecurity: Not on file  Transportation Needs: Not on file  Physical Activity: Not on file  Stress: Not on file  Social Connections: Not on file     Family History: The patient's family history includes Heart disease in his father; Hodgkin's lymphoma in his sister; Hyperlipidemia in his brother.  ROS:   Please see the history of present illness.    All other systems reviewed and are negative.  EKGs/Labs/Other Studies Reviewed:    The following studies were reviewed today: EKG reveals sinus rhythm and nonspecific ST-T changes.  Patient has first-degree AV block, right bundle branch block and left anterior fascicular block.   Recent Labs: No results found for requested labs within last 365 days.  Recent Lipid Panel    Component Value Date/Time   CHOL 301 (H) 04/10/2018 1223   TRIG 114 04/10/2018 1223   HDL 48 04/10/2018 1223   CHOLHDL 6.3 (H) 04/10/2018 1223   LDLCALC 230 (H) 04/10/2018 1223    Physical Exam:    VS:  BP 128/66   Pulse (!) 58   Ht 5\' 9"  (1.753 m)   Wt 211 lb 6.4 oz (95.9 kg)   SpO2 95%   BMI 31.22 kg/m     Wt Readings from Last 3 Encounters:  02/16/22 211 lb  6.4 oz (95.9 kg)  04/19/21 193 lb (87.5 kg)  10/12/20 198 lb (89.8 kg)     GEN: Patient is in no acute distress HEENT: Normal NECK: No JVD; No carotid bruits LYMPHATICS: No lymphadenopathy CARDIAC: Hear sounds regular, 2/6 systolic murmur at the apex. RESPIRATORY:  Clear to auscultation without rales, wheezing or rhonchi  ABDOMEN: Soft, non-tender, non-distended MUSCULOSKELETAL:  No edema; No deformity  SKIN: Warm and dry NEUROLOGIC:  Alert and oriented x 3 PSYCHIATRIC:  Normal affect   Signed, Jenean Lindau, MD  02/16/2022 10:54 AM    [place]

## 2022-03-09 DIAGNOSIS — R609 Edema, unspecified: Secondary | ICD-10-CM | POA: Diagnosis not present

## 2022-03-09 DIAGNOSIS — G8929 Other chronic pain: Secondary | ICD-10-CM | POA: Diagnosis not present

## 2022-03-09 DIAGNOSIS — M159 Polyosteoarthritis, unspecified: Secondary | ICD-10-CM | POA: Diagnosis not present

## 2022-03-09 DIAGNOSIS — E7800 Pure hypercholesterolemia, unspecified: Secondary | ICD-10-CM | POA: Diagnosis not present

## 2022-03-09 DIAGNOSIS — R7303 Prediabetes: Secondary | ICD-10-CM | POA: Diagnosis not present

## 2022-03-09 DIAGNOSIS — E291 Testicular hypofunction: Secondary | ICD-10-CM | POA: Diagnosis not present

## 2022-03-09 DIAGNOSIS — J309 Allergic rhinitis, unspecified: Secondary | ICD-10-CM | POA: Diagnosis not present

## 2022-03-09 DIAGNOSIS — Z6831 Body mass index (BMI) 31.0-31.9, adult: Secondary | ICD-10-CM | POA: Diagnosis not present

## 2022-03-09 DIAGNOSIS — M545 Low back pain, unspecified: Secondary | ICD-10-CM | POA: Diagnosis not present

## 2022-03-09 DIAGNOSIS — I2510 Atherosclerotic heart disease of native coronary artery without angina pectoris: Secondary | ICD-10-CM | POA: Diagnosis not present

## 2022-03-09 DIAGNOSIS — I10 Essential (primary) hypertension: Secondary | ICD-10-CM | POA: Diagnosis not present

## 2022-03-31 LAB — LIPID PANEL
Chol/HDL Ratio: 4 ratio (ref 0.0–5.0)
Cholesterol, Total: 121 mg/dL (ref 100–199)
HDL: 30 mg/dL — ABNORMAL LOW (ref >39)
LDL Chol Calc (NIH): 70 mg/dL (ref 0–99)
Triglycerides: 116 mg/dL (ref 0–149)
VLDL Cholesterol Cal: 21 mg/dL (ref 5–40)

## 2022-03-31 LAB — COMPREHENSIVE METABOLIC PANEL
ALT: 34 IU/L (ref 0–44)
AST: 30 IU/L (ref 0–40)
Albumin/Globulin Ratio: 2.6 — ABNORMAL HIGH (ref 1.2–2.2)
Albumin: 4.9 g/dL (ref 3.9–4.9)
Alkaline Phosphatase: 43 IU/L — ABNORMAL LOW (ref 44–121)
BUN/Creatinine Ratio: 24 (ref 10–24)
BUN: 24 mg/dL (ref 8–27)
Bilirubin Total: 0.4 mg/dL (ref 0.0–1.2)
CO2: 21 mmol/L (ref 20–29)
Calcium: 9.5 mg/dL (ref 8.6–10.2)
Chloride: 105 mmol/L (ref 96–106)
Creatinine, Ser: 1.01 mg/dL (ref 0.76–1.27)
Globulin, Total: 1.9 g/dL (ref 1.5–4.5)
Glucose: 114 mg/dL — ABNORMAL HIGH (ref 70–99)
Potassium: 4.6 mmol/L (ref 3.5–5.2)
Sodium: 141 mmol/L (ref 134–144)
Total Protein: 6.8 g/dL (ref 6.0–8.5)
eGFR: 82 mL/min/{1.73_m2} (ref >59)

## 2023-03-08 NOTE — Progress Notes (Signed)
Cardiology Office Note:  .   Date:  03/09/2023  ID:  Eddie Nelson, DOB 04/17/1955, MRN [id number] PCP: Eddie Curia, MD  [facility] Providers Cardiologist:  Eddie Brothers, MD    History of Present Illness: Eddie Nelson   Eddie Nelson is a 68 y.o. male with a past medical history of CAD s/p CABG x 3, hypertension, dyslipidemia, carotid artery stenosis, RBBB.  04/15/2018 CABG x 3 LIMA to LAD, SVG to OM1, SVG to posterior descending 04/12/2018 echo EF 55 to 60%, mildly increased left wall thickness, diastolic parameters consistent with impaired relaxation 04/12/2018 carotid ultrasound moderate bilateral stenosis 04/11/2018 left heart cath severe multivessel CAD >> CVTS consultation 04/10/2018 Myoview findings consistent with ischemia, intermediate risk  Most recently was evaluated by Dr. Tomie Nelson on 02/16/2022, was stable from a cardiac perspective, fenofibrate was added for his dyslipidemia, he was advised to follow-up in 6 months.  He presents today for follow-up of his CAD.  He states he has been doing fair since he was last evaluated in our office.  He has frequent episodes of chest pain--they are atypical for angina however, his EKG today reveals a new RBBB. He does stay active, typically does not have any anginal complaints during exertion. His BP is slightly elevated in the office today.  He states his blood pressure medication has been reduced following weight loss however, he has regained the weight. He denies  palpitations, dyspnea, pnd, orthopnea, Eddie, v, dizziness, syncope, edema, weight gain, or early satiety.   ROS: Review of Systems  Cardiovascular:  Positive for chest pain.     Studies Reviewed: Eddie Nelson   EKG Interpretation Date/Time:  Friday March 09 2023 11:38:32 EST Ventricular Rate:  61 PR Interval:  196 QRS Duration:  140 QT Interval:  410 QTC Calculation: 412 R Axis:   -40  Text Interpretation: Normal sinus rhythm Left axis deviation Right bundle branch block  Inferior infarct (cited on or before 15-Apr-2018) When compared with ECG of 16-Apr-2018 07:08, Right bundle branch block is now Present Confirmed by Eddie Nelson) on 03/09/2023 11:41:34 AM    Cardiac Studies & Procedures   CARDIAC CATHETERIZATION  CARDIAC CATHETERIZATION 04/11/2018  Narrative  Prox RCA lesion is 80% stenosed.  Mid RCA lesion is 95% stenosed.  Ost RPDA to RPDA lesion is 99% stenosed.  Prox Cx lesion is 50% stenosed.  Ost 1st Mrg lesion is 90% stenosed.  Prox LAD to Mid LAD lesion is 95% stenosed.  The left ventricular ejection fraction is 50-55% by visual estimate.  Severe multivessel coronary obstructive disease with subtotal stenosis of the mid LAD between the second and third diagonal vessels; 50 and 95% stenosis of the proximal circumflex extending into a large OM1 branch which supplies the apex; and large dominant RCA with 80% proximal stenosis, 95% mid stenosis, patent and filling of the distal RCA with possible subtotal occlusion of the proximal PDA with both right to PDA and LAD to PDA retrograde collaterals.  Normal LV function with an EF of 50%.  There is focal mild mid inferior hypocontractility and a suggestion of possible mild focal anterolateral hypocontractility.  LVEDP is 17 mm.  RECOMMENDATION: Surgical consultation will be obtained for CABG revascularization surgery.  Patient has severe multivessel CAD with excellent targets for bypass surgery.  We will keep him overnight so that this process can be initiated and will initiate anti-ischemic medical therapy.  The patient will initially be treated with aspirin.  High potency statin therapy with target LDL  less than 70.  Findings Coronary Findings Diagnostic  Dominance: Right  Left Anterior Descending Prox LAD to Mid LAD lesion is 95% stenosed.  Left Circumflex Prox Cx lesion is 50% stenosed.  First Obtuse Marginal Branch Ost 1st Mrg lesion is 90% stenosed.  Right Coronary Artery Prox  RCA lesion is 80% stenosed. Mid RCA lesion is 95% stenosed.  Right Posterior Descending Artery Collaterals RPDA filled by collaterals from Acute Mrg.  Collaterals RPDA filled by collaterals from Dist LAD.  Ost RPDA to RPDA lesion is 99% stenosed.  Intervention  No interventions have been documented.   STRESS TESTS  MYOCARDIAL PERFUSION IMAGING 04/10/2018  Narrative  The left ventricular ejection fraction is mildly decreased (45-54%).  Nuclear stress EF: 54%.  There was no ST segment deviation noted during stress.  Defect 1: There is a medium defect of moderate severity present in the basal inferoseptal, mid inferoseptal and apex location.  Findings consistent with ischemia.  This is an intermediate risk study.  Good exercise tolerance. Hypertensive response.  ECHOCARDIOGRAM  ECHOCARDIOGRAM COMPLETE 04/12/2018  Narrative ECHOCARDIOGRAM REPORT    Patient Name:   Eddie Nelson Date of Exam: 04/12/2018 Medical Rec #:  [id number]           Height:       69.0 in Accession #:    [id number]          Weight:       198.2 lb Date of Birth:  [birth year]           BSA:          2.06 m Patient Age:    62 years            BP:           131/77 mmHg Patient Gender: M                   HR:           66 bpm. Exam Location:  Inpatient   Procedure: 2D Echo  Indications:    CAD (coronary artery disease)  History:        Patient has no prior history of Echocardiogram examinations. Risk Factors: Diabetes, Hypertension and Dyslipidemia.  Sonographer:    Ross Ludwig RDCS (AE) Referring Phys: [id number] PETER VAN TRIGT  IMPRESSIONS   1. The left ventricle has normal systolic function, with an ejection fraction of 55-60%. The cavity size was normal. There is mildly increased left ventricular wall thickness. Left ventricular diastolic Doppler parameters are consistent with impaired relaxation Indeterminent filling pressures The E/e' is 8-15. 2. The right ventricle has normal systolic  function. The cavity was normal. There is no increase in right ventricular wall thickness. 3. The mitral valve is normal in structure. 4. The tricuspid valve is normal in structure. 5. The aortic valve is normal in structure. no stenosis of the aortic valve. 6. The aortic root and ascending aorta are normal in size and structure.  SUMMARY  LVEF 55-60%, mild LVH, normal wall motion, grade 1 DD, indeterminate LV filling pressure, normal biatrial size, trivial TR, RVSP 19 mmHg, normal IVC FINDINGS Left Ventricle: The left ventricle has normal systolic function, with an ejection fraction of 55-60%. The cavity size was normal. There is mildly increased left ventricular wall thickness. Left ventricular diastolic Doppler parameters are consistent with impaired relaxation Indeterminent filling pressures The E/e' is 8-15. Right Ventricle: The right ventricle has normal systolic function. The cavity was normal. There is no increase in  right ventricular wall thickness. Left Atrium: left atrial size was normal in size Right Atrium: right atrial size was normal in size. Right atrial pressure is estimated at 3 mmHg. Interatrial Septum: No atrial level shunt detected by color flow Doppler. Pericardium: There is no evidence of pericardial effusion. Mitral Valve: The mitral valve is normal in structure. Mitral valve regurgitation is trivial by color flow Doppler. Tricuspid Valve: The tricuspid valve is normal in structure. Tricuspid valve regurgitation is trivial by color flow Doppler. Aortic Valve: The aortic valve is normal in structure. Aortic valve regurgitation was not visualized by color flow Doppler. There is no stenosis of the aortic valve, with a calculated valve area of 2.42 cm. Pulmonic Valve: The pulmonic valve was grossly normal. Pulmonic valve regurgitation is not visualized by color flow Doppler. Aorta: The aortic root and ascending aorta are normal in size and structure. Venous: The inferior  vena cava measures 1.68 cm, is normal in size with greater than 50% respiratory variability.  LEFT VENTRICLE PLAX 2D (Teich) LV EF:          58.9 %   Diastology LVIDd:          5.10 cm  LV e' lateral:   9.14 cm/s LVIDs:          3.50 cm  LV E/e' lateral: 5.9 LV PW:          1.00 cm  LV e' medial:    7.40 cm/s LV IVS:         1.00 cm  LV E/e' medial:  7.3 LVOT diam:      2.00 cm LV SV:          73 ml LVOT Area:      3.14 cm  RIGHT VENTRICLE RV S prime:     10.00 cm/s TAPSE (M-mode): 1.8 cm RVSP:           18.7 mmHg  LEFT ATRIUM             Index       RIGHT ATRIUM           Index LA diam:        3.80 cm 1.85 cm/m  RA Pressure: 3 mmHg LA Vol (A2C):   49.2 ml 23.91 ml/m RA Area:     14.30 cm LA Vol (A4C):   34.5 ml 16.76 ml/m RA Volume:   34.80 ml  16.91 ml/m LA Biplane Vol: 42.6 ml 20.70 ml/m AORTIC VALVE AV Area (Vmax):    2.54 cm AV Area (Vmean):   2.49 cm AV Area (VTI):     2.42 cm AV Vmax:           126.00 cm/s AV Vmean:          89.700 cm/s AV VTI:            0.235 m AV Peak Grad:      6.4 mmHg AV Mean Grad:      4.0 mmHg LVOT Vmax:         102.00 cm/s LVOT Vmean:        71.100 cm/s LVOT VTI:          0.181 m LVOT/AV VTI ratio: 0.77  AORTA Ao Root diam: 2.90 cm  MV E velocity: 53.90 cm/s TRICUSPID VALVE MV A velocity: 44.90 cm/s TR Peak grad:   15.7 mmHg MV E/A ratio:  1.20       TR Vmax:        198.00  cm/s RVSP:           18.7 mmHg  IVC IVC diam: 1.68 cm   Zoila Shutter MD Electronically signed by Zoila Shutter MD Signature Date/Time: 04/12/2018/3:05:44 PM    Final  TEE  ECHO INTRAOPERATIVE TEE 04/15/2018  Narrative *INTRAOPERATIVE TRANSESOPHAGEAL REPORT *    Patient Name:   Eddie Nelson Date of Exam: 04/15/2018 Medical Rec #:  [id number]           Height:       69.0 in Accession #:    [id number]          Weight:       196.6 lb Date of Birth:  November 16, 1955           BSA:          2.05 m Patient Age:    62 years            BP:            114/66 mmHg Patient Gender: M                   HR:           67 bpm. Exam Location:  Anesthesiology  Transesophogeal exam was perform intraoperatively during surgical procedure. Patient was closely monitored under general anesthesia during the entirety of examination.  Indications:     CAD History:         CAD. Performing Phys: [id number] PETER VAN TRIGT Diagnosing Phys: Gaynelle Adu MD  Complications: No known complications during this procedure. POST-OP IMPRESSIONS Overall, there were no significant changes from pre-bypass.  PRE-OP FINDINGS Left Ventricle: The left ventricle has normal systolic function, with an ejection fraction of 55-60%. The cavity size was normal. There is mildly increased left ventricular wall thickness. No evidence of left ventricular regional wall motion abnormalities. Right Ventricle: The right ventricle has normal systolic function. The cavity was mildly enlarged. There is no increase in right ventricular wall thickness. Left Atrium: Left atrial size was not assessed. Right Atrium: Right atrial size was not assessed. . Interatrial Septum: No atrial level shunt detected by color flow Doppler. Pericardium: There is no evidence of pericardial effusion. Mitral Valve: The mitral valve is normal in structure. No thickening of the mitral valve leaflet. No calcification of the mitral valve leaflet. Mitral valve regurgitation is trivial by color flow Doppler. The MR jet is centrally-directed. Tricuspid Valve: The tricuspid valve was normal in structure. Tricuspid valve regurgitation was not visualized by color flow Doppler. Aortic Valve: The aortic valve is normal in structure. Aortic valve regurgitation was not visualized by color flow Doppler. There is no evidence of aortic valve stenosis. There is no evidence of a vegetation on the aortic valve. Pulmonic Valve: The pulmonic valve was normal in structure. Pulmonic valve regurgitation is trivial by color flow  Doppler.    Gaynelle Adu MD Electronically signed by Gaynelle Adu MD Signature Date/Time: 04/15/2018/4:21:56 PM   Final            Risk Assessment/Calculations:          Physical Exam:   VS:  BP (!) 146/80 (BP Location: Right Arm, Patient Position: Sitting, Cuff Size: Normal)   Pulse 61   Ht 5\' 9"  (1.753 m)   Wt 210 lb (95.3 kg)   SpO2 95%   BMI 31.01 kg/m    Wt Readings from Last 3 Encounters:  03/09/23 210 lb (95.3 kg)  02/16/22 211 lb 6.4 oz (95.9 kg)  04/19/21 193 lb (87.5 kg)    GEN: Well nourished, well developed in no acute distress NECK: No JVD; No carotid bruits CARDIAC: RRR, no murmurs, rubs, gallops RESPIRATORY:  Clear to auscultation without rales, wheezing or rhonchi  ABDOMEN: Soft, non-tender, non-distended EXTREMITIES:  No edema; No deformity   ASSESSMENT AND PLAN: .   CAD - s/p CABG x 3 2020.  He has not had an ischemic evaluation since his bypass.  He does have a new RBBB, also has some episodes of chest pain--although they are not consistent with angina I think we need to proceed with an ischemic evaluation.  Will arrange for a Myoview.  Continue aspirin 81 mg daily, continue Lipitor 80 mg daily, continue nitroglycerin as needed, continue metoprolol 25 mg daily.  RBBB - this is a new finding on his EKG, he has not had an ischemic evaluation and is having atypical chest pain. Myoview for ischemic evaluation.   Dyslipidemia -most recent LDL was 70 on 03/31/2022.  He is enrolled in a study with his PCP regarding an injectable medication every 3 months, he is having his cholesterol checked very frequently and adjustments made by his PCP.  HTN -blood pressure slightly elevated today, will have him keep a blood pressure log at home x 2 weeks.  Continue lisinopril 10 mg daily, continue metoprolol 25 mg daily.     Informed Consent   Shared Decision Making/Informed Consent The risks [chest pain, shortness of breath, cardiac arrhythmias, dizziness,  blood pressure fluctuations, myocardial infarction, stroke/transient ischemic attack, nausea, vomiting, allergic reaction, radiation exposure, metallic taste sensation and life-threatening complications (estimated to be 1 in 10,000)], benefits (risk stratification, diagnosing coronary artery disease, treatment guidance) and alternatives of a nuclear stress test were discussed in detail with Mr. Kropf and he agrees to proceed.     Dispo: Myoview, BP log x 2 weeks.   Signed, Flossie Dibble, NP

## 2023-03-09 ENCOUNTER — Ambulatory Visit: Payer: PPO | Attending: Cardiology | Admitting: Cardiology

## 2023-03-09 ENCOUNTER — Encounter: Payer: Self-pay | Admitting: Cardiology

## 2023-03-09 VITALS — BP 146/80 | HR 61 | Ht 69.0 in | Wt 210.0 lb

## 2023-03-09 DIAGNOSIS — I10 Essential (primary) hypertension: Secondary | ICD-10-CM | POA: Diagnosis not present

## 2023-03-09 DIAGNOSIS — Z951 Presence of aortocoronary bypass graft: Secondary | ICD-10-CM | POA: Diagnosis not present

## 2023-03-09 DIAGNOSIS — I4510 Unspecified right bundle-branch block: Secondary | ICD-10-CM

## 2023-03-09 DIAGNOSIS — R072 Precordial pain: Secondary | ICD-10-CM

## 2023-03-09 DIAGNOSIS — I2510 Atherosclerotic heart disease of native coronary artery without angina pectoris: Secondary | ICD-10-CM

## 2023-03-09 DIAGNOSIS — E782 Mixed hyperlipidemia: Secondary | ICD-10-CM

## 2023-03-09 NOTE — Patient Instructions (Addendum)
Medication Instructions:  Your physician recommends that you continue on your current medications as directed. Please refer to the Current Medication list given to you today.  *If you need a refill on your cardiac medications before your next appointment, please call your pharmacy*   Lab Work: NONE If you have labs (blood work) drawn today and your tests are completely normal, you will receive your results only by: MyChart Message (if you have MyChart) OR A paper copy in the mail If you have any lab test that is abnormal or we need to change your treatment, we will call you to review the results.   Testing/Procedures: Your physician has requested that you have a lexiscan myoview. For further information please visit [email or web address] Please follow instruction sheet, as given.   The test will take approximately 3 to 4 hours to complete; you may bring reading material.  If someone comes with you to your appointment, they will need to remain in the main lobby due to limited space in the testing area. How to prepare for your Myocardial Perfusion Test:             Do not take Erectile Dysfunction medication 48 hours prior to test. Do not eat or drink 3 hours prior to your test, except you may have water. Do not consume products containing caffeine (regular or decaffeinated) 12 hours prior to your test. (ex: coffee, chocolate, sodas, tea). Do bring a list of your current medications with you.  If not listed below, you may take your medications as normal. Do wear comfortable clothes (no dresses or overalls) and walking shoes, tennis shoes preferred (No heels or open toe shoes are allowed). Do NOT wear cologne, perfume, aftershave, or lotions (deodorant is allowed). If these instructions are not followed, your test will have to be rescheduled.    Follow-Up: At [facility], you and your health needs are our priority.  As part of our continuing mission to provide you with exceptional  heart care, we have created designated Provider Care Teams.  These Care Teams include your primary Cardiologist (physician) and Advanced Practice Providers (APPs -  Physician Assistants and Nurse Practitioners) who all work together to provide you with the care you need, when you need it.  We recommend signing up for the patient portal called "MyChart".  Sign up information is provided on this After Visit Summary.  MyChart is used to connect with patients for Virtual Visits (Telemedicine).  Patients are able to view lab/test results, encounter notes, upcoming appointments, etc.  Non-urgent messages can be sent to your provider as well.   To learn more about what you can do with MyChart, go to [email or web address].    Your next appointment:   6 month(s)  Provider:   Belva Crome, MD    Other Instructions Check and record BP two times daily, once in the morning 1-2 hours after taking medication and once in the evening before dinner. You can drop off log at our office or send it through MyChart.

## 2023-03-13 ENCOUNTER — Ambulatory Visit: Payer: PPO | Attending: Cardiology

## 2023-03-13 DIAGNOSIS — R072 Precordial pain: Secondary | ICD-10-CM | POA: Diagnosis not present

## 2023-03-13 MED ORDER — TECHNETIUM TC 99M TETROFOSMIN IV KIT
10.1000 | PACK | Freq: Once | INTRAVENOUS | Status: AC | PRN
  Administered 2023-03-13: 10.1 via INTRAVENOUS

## 2023-03-13 MED ORDER — TECHNETIUM TC 99M TETROFOSMIN IV KIT
31.8000 | PACK | Freq: Once | INTRAVENOUS | Status: AC | PRN
Start: 2023-03-13 — End: 2023-03-13
  Administered 2023-03-13: 31.8 via INTRAVENOUS

## 2023-03-21 LAB — MYOCARDIAL PERFUSION IMAGING
Angina Index: 0
Duke Treadmill Score: 7
Estimated workload: 10.1
Exercise duration (min): 7 min
Exercise duration (sec): 1 s
LV dias vol: 124 mL (ref 62–150)
LV sys vol: 53 mL
MPHR: 152 {beats}/min
Nuc Stress EF: 57 %
Peak HR: 146 {beats}/min
Percent HR: 96 %
Rest HR: 52 {beats}/min
Rest Nuclear Isotope Dose: 10.1 mCi
SDS: 0
SRS: 2
SSS: 2
ST Depression (mm): 0 mm
Stress Nuclear Isotope Dose: 31.8 mCi
TID: 1.14

## 2023-03-29 ENCOUNTER — Telehealth: Payer: Self-pay | Admitting: Emergency Medicine

## 2023-03-29 NOTE — Telephone Encounter (Signed)
-----   Message from Flossie Dibble sent at 03/26/2023  8:05 AM EST ----- Stress test was normal, meaning during times of stress your heart is receiving good blood supply. This is a good result.

## 2023-03-29 NOTE — Telephone Encounter (Signed)
Left voice mail

## 2023-04-02 NOTE — Telephone Encounter (Signed)
Lvm 2/17

## 2023-04-03 NOTE — Telephone Encounter (Signed)
LVM 2/18 

## 2023-04-03 NOTE — Telephone Encounter (Signed)
Reviewed results with patient as per [facility] note. Patient had questions with results and wanted to know how his arteries looked. Explained to patient that a stress test shows how his heart response to stress. Patient stated that he wanted to know more since he still "feels bad". Advised patient that I could make him an appointment to discuss results with provider, however patient declined and stated he would keep his previously arranged follow up around July.  Patient denies having any further questions.

## 2023-07-20 ENCOUNTER — Ambulatory Visit: Admitting: Neurology

## 2023-07-20 ENCOUNTER — Encounter: Payer: Self-pay | Admitting: Neurology

## 2023-07-20 VITALS — BP 133/77 | HR 54 | Resp 16 | Ht 69.0 in | Wt 212.0 lb

## 2023-07-20 DIAGNOSIS — R519 Headache, unspecified: Secondary | ICD-10-CM | POA: Diagnosis not present

## 2023-07-20 DIAGNOSIS — H539 Unspecified visual disturbance: Secondary | ICD-10-CM

## 2023-07-20 MED ORDER — OXYCODONE-ACETAMINOPHEN 10-325 MG PO TABS: 1.0000 | ORAL_TABLET | ORAL | 0 refills | Status: DC | PRN

## 2023-07-20 NOTE — Patient Instructions (Addendum)
 the facial nerve affected(cranial nerve VII which is the motor nerve and can cause the facial weakness)  Sounds like the virus may have affected the trigeminal nerve(cranial nerve V is sensory nerve and causes pain when injured like burning, electric, num) or is the pain the after effect from the blisters(V3 distribution of the trigeminal nerve)  Ophthalmology - the blurry vision may be dry eye due to nerve involvement and lid not closing well but still see the ophthalmologist At this time you have done everything proper and watchful improvement along with follow up and eye exam and pain management if needed blurry vision but this is likely dry eye from the nerve involvement but see ophthalmology Can get an MRI but the diagnosis is clear and you have improved would hold off  Bell's Palsy in Adults: What to Know  Bell's palsy is a condition that causes short-term weakness or paralysis of the muscles in a part of your face. With paralysis, you're not able to move the affected muscles. Bell's palsy happens when a nerve in your face, called the seventh cranial nerve, gets irritated, swollen, or has pressure put on it. This nerve runs along your skull, under your ear, and to the side of your face. It controls movements of your face like blinking, closing your eyes, smiling, and frowning. The symptoms of Bell's palsy may go away on their own in a few weeks. Treatment is sometimes needed. What are the causes? The exact cause of Bell's palsy isn't known. It may be caused by irritation and swelling or an infection from a virus, such as the chickenpox (herpes zoster), Epstein-Barr, or mumps virus. What increases the risk? You're more likely to get Bell's palsy if you: Are pregnant. Have diabetes. Have high blood pressure. Are obese. Have recently had an infection in your nose, throat, or upper airways. Other things that may trigger this condition include: A virus that's been in your body but hasn't  been active. A weakened immune system, which is your body's defense system. It may be weak due to: Stress. Not getting enough sleep. Physical trauma or injury. A minor illness. Autoimmune syndromes. Infection or irritation and swelling of the facial nerve. Damage to the covering of the nerve fibers. What are the signs or symptoms? Symptoms of Bell's palsy include: Sudden weakness on one side of the face. Drooping of the eyelid, eyebrow, and corner of the mouth. Drooling from one side of the mouth. Having trouble closing the eyelid. If you're living with Bell's palsy, you may also develop: Pain or unusual feelings in your face. A lot of tearing in one eye. Changes in how things taste. Being sensitive to sound in one ear. Pain behind one ear. Pain around one side of the jaw. Trouble eating or drinking. Most of the time, only one side of the face is affected. In rare cases, Bell's palsy may affect the whole face. How is this diagnosed? Diagnosis of Bell's palsy will include a physical exam. Your symptoms and medical history will be checked. You may also need to see health care providers who are specialists. This may be an expert in nerve disorders called a neurologist or an eye specialist called an ophthalmologist. You may have tests, such as: An electromyogram. This test checks for nerve damage. Imaging tests, such as a CT scan or an MRI. Blood tests. How is this treated? Bell's palsy affects every person differently. Sometimes symptoms go away without treatment within a few weeks. If treatment is needed, it  varies from person to person. The goal of treatment is to reduce irritation and swelling and to protect the eye from damage. Treatment for Bell's palsy may include: Medicines, such as: Steroids to reduce irritation and swelling. Antiviral medicines. Medicines for pain, like aspirin , acetaminophen , or ibuprofen. Eye drops or ointment to keep your eye moist. Eye protection, if  you can't close your eyelid. Physical therapy exercises to improve movement and strength in your face muscles. This may also include acupuncture or massage. Follow these instructions at home:  Take your medicines only as told. If your eye is affected: Keep your eye moist with eye drops or ointment as told by your provider. Follow your provider's instructions for eye care and protection. Do any physical therapy exercises as told. Where to find more information American Academy of Ophthalmology (AAO): SeeTennis.com.ee Contact a health care provider if: You have a fever or chills. Your symptoms don't get better within 2-3 weeks, or your symptoms get worse. Your eye is red, irritated, or painful. You have new symptoms. You feel light-headed. Get help right away if: You have weakness or numbness in a part of your body other than your face. You have trouble swallowing. You have neck pain or stiffness. You have shortness of breath. These symptoms may be an emergency. Call 911 right away. Do not wait to see if the symptoms will go away. Do not drive yourself to the hospital. This information is not intended to replace advice given to you by your health care provider. Make sure you discuss any questions you have with your health care provider. Document Revised: 07/03/2022 Document Reviewed: 07/03/2022 Elsevier Patient Education  2024 [employer].

## 2023-07-20 NOTE — Progress Notes (Signed)
 [facility]    Provider:  Dr Tresia Fruit Requesting Provider: Angelique Barer, MD Primary Care Provider:  Angelique Barer, MD  CC:  [place]um Syndrome  HPI:  Eddie Nelson is a 68 y.o. male here as requested by Angelique Barer, MD for   has Essential hypertension; Osteoarthritis; Chronic lumbar pain; Diet-controlled diabetes mellitus (HCC); Mixed dyslipidemia; CAD (coronary artery disease), native coronary artery; S/P CABG x 3; and Hypertension on their problem list.  5 weeks ago started out with what he thought was oral herpes, dr Merlyn Starring treated him for shingles, and it ran up the side of the face to the ear, did not go into  ear or eye but has a sore on the side of his head but mild and went away, no ear or eye involvement. On the right side of the face he still has sagging. 5th and 7th nerve involvement. Had a secind course of valtres with some additional lesions and just finished a few days ago. The shingles came first and then subsided then they improved and he felt numbness on his lower gums and over the course of a Saturday - Sunday this started on May 17th and he saw dr Merlyn Starring again. He was initially started on prednisone by urgent care. Numbness in the chin where the blisters were and up his cheek so unclear if involvement of v3 trigem or numbness secondary to the blisters. No residual pain. His facial weakness is improving. He still drool. He has been using eye drops. Ild droop overall improving and moderate pain in the chin and right side of face, like a burning and morphed into an electrical current. Pain was in the whole right side of the head. Electrical feel on the right side. He needs ophthalmologist right away he has some blurry vision in the right eye.no double vision or other symptoms. Prior to symptoms he was fatigued. No other focal neurologic deficits, associated symptoms, inciting events or modifiable factors.  Reviewed notes, labs and imaging from outside physicians, which  showed:  Reviewed Dr. Ceola Collie notes started on valtrex 100mg  tid for likely ramsey hunt. He was treated prior to that with valtrex and prednisone  Review of Systems: Patient complains of symptoms per HPI as well as the following symptoms none. Pertinent negatives and positives per HPI. All others negative.   Social History   Socioeconomic History   Marital status: Single    Spouse name: Not on file   Number of children: 0   Years of education: Not on file   Highest education level: Bachelor's degree (e.g., BA, AB, BS)  Occupational History   Not on file  Tobacco Use   Smoking status: Former    Passive exposure: Never   Smokeless tobacco: Never  Vaping Use   Vaping status: Never Used  Substance and Sexual Activity   Alcohol use: Not Currently    Alcohol/week: 2.0 standard drinks of alcohol    Types: 1 Cans of beer, 1 Shots of liquor per week   Drug use: Not Currently   Sexual activity: Not on file  Other Topics Concern   Not on file  Social History Narrative   Not on file   Social Drivers of Health   Financial Resource Strain: Not on file  Food Insecurity: Not on file  Transportation Needs: Not on file  Physical Activity: Not on file  Stress: Not on file  Social Connections: Not on file  Intimate Partner Violence: Not on file    Family  History  Problem Relation Age of Onset   Heart disease Father    Hodgkin's lymphoma Sister    Hyperlipidemia Brother     Past Medical History:  Diagnosis Date   CAD (coronary artery disease), native coronary artery 04/11/2018   Chronic lumbar pain 04/10/2018   Diet-controlled diabetes mellitus (HCC) 04/10/2018   Essential hypertension 04/10/2018   Hypertension    Mixed dyslipidemia 04/10/2018   Osteoarthritis    S/P CABG x 3 04/15/2018    Patient Active Problem List   Diagnosis Date Noted   Hypertension    S/P CABG x 3 04/15/2018   CAD (coronary artery disease), native coronary artery 04/11/2018   Essential hypertension  04/10/2018   Osteoarthritis 04/10/2018   Chronic lumbar pain 04/10/2018   Diet-controlled diabetes mellitus (HCC) 04/10/2018   Mixed dyslipidemia 04/10/2018    Past Surgical History:  Procedure Laterality Date   CORONARY ARTERY BYPASS GRAFT N/A 04/15/2018   Procedure: CORONARY ARTERY BYPASS GRAFTING (CABG), ON PUMP, TIMES THREE, USING LEFT INTERNAL MAMMARY ARTERY  AND ENDOSCOPICALLY HARVESTED LEFT GREATER SAPHENOUS VEIN;  Surgeon: Heriberto London, MD;  Location: [facility] OR;  Service: Open Heart Surgery;  Laterality: N/A;   LEFT HEART CATH AND CORONARY ANGIOGRAPHY N/A 04/11/2018   Procedure: LEFT HEART CATH AND CORONARY ANGIOGRAPHY;  Surgeon: Millicent Ally, MD;  Location: [facility] INVASIVE CV LAB;  Service: Cardiovascular;  Laterality: N/A;   TEE WITHOUT CARDIOVERSION N/A 04/15/2018   Procedure: TRANSESOPHAGEAL ECHOCARDIOGRAM (TEE);  Surgeon: Matt Song, Donata Fryer, MD;  Location: [facility] OR;  Service: Open Heart Surgery;  Laterality: N/A;    Current Outpatient Medications  Medication Sig Dispense Refill   aspirin  81 MG EC tablet Take 81 mg by mouth daily.     atorvastatin  (LIPITOR ) 80 MG tablet Take 1 tablet (80 mg total) by mouth daily at 6 PM. 30 tablet 2   ezetimibe (ZETIA) 10 MG tablet Take 10 mg by mouth daily.     lisinopril  (PRINIVIL ,ZESTRIL ) 10 MG tablet Take 1 tablet by mouth daily.     metoprolol  succinate (TOPROL -XL) 25 MG 24 hr tablet Take 25 mg by mouth daily.     nitroGLYCERIN  (NITROSTAT ) 0.4 MG SL tablet Place 0.4 mg under the tongue every 5 (five) minutes as needed for chest pain.     oxyCODONE -acetaminophen  (PERCOCET) 10-325 MG tablet Take 1 tablet by mouth every 4 (four) hours as needed for pain. 42 tablet 0   No current facility-administered medications for this visit.    Allergies as of 07/20/2023   (No Known Allergies)    Vitals: BP 133/77   Pulse (!) 54   Resp 16   Ht 5\' 9"  (1.753 m)   Wt 212 lb (96.2 kg)   SpO2 98%   BMI 31.31 kg/m  Last Weight:  Wt Readings from Last 1  Encounters:  07/20/23 212 lb (96.2 kg)   Last Height:   Ht Readings from Last 1 Encounters:  07/20/23 5\' 9"  (1.753 m)     Physical exam: Exam: Gen: NAD, conversant, well nourised, well groomed                     CV: RRR, no MRG. No Carotid Bruits. No peripheral edema, warm, nontender Eyes: Conjunctivae clear without exudates or hemorrhage  Neuro: Detailed Neurologic Exam  Speech:    Speech is normal; fluent and spontaneous with normal comprehension.  Cognition:    The patient is oriented to person, place, and time;     recent  and remote memory intact;     language fluent;     normal attention, concentration,     fund of knowledge Cranial Nerves: right eyelid closure weakness with bell's phenomenon, right lower facial droop, mild right forehead weakness mild overall.      The pupils are equal, round, and reactive to light. The fundi are normal and spontaneous venous pulsations are present. Visual fields are full to finger confrontation. Extraocular movements are intact. Trigeminal sensation is impaired on right side of the face v3. The palate elevates in the midline. Hearing intact. Voice is normal. Shoulder shrug is normal. The tongue has normal motion without fasciculations.   Coordination: nml  Gait: nml  Motor Observation:    No asymmetry, no atrophy, and no involuntary movements noted. Tone:    Normal muscle tone.    Posture:    Posture is normal. normal erect    Strength:    Strength is V/V in the upper and lower limbs.      Sensation: intact to LT     Reflex Exam:  DTR's:    Deep tendon reflexes in the upper and lower extremities are symmetrical bilaterally.   Toes:    The toes are equivocal bilaterally.   Clonus:    Clonus is absent.    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   -the facial nerve affected(cranial nerve VII which is the motor nerve and can cause the facial weakness) -Sounds like the virus may  have affected the trigeminal nerve as it can affect multipe nerves(cranial nerve V is sensory nerve and causes pain when injured like burning, electric, num) or is the pain the after effect from the blisters in V3 distribution of the trigeminal nerve unclear - Ophthalmology - the blurry vision may be dry eye due to nerve involvement and lid not closing well but still see the ophthalmologist, he is practising good eyecare and the lesions never made it quite int the ear or near the eye because of prompt treatment - At this time you have done everything proper and watchful improvement along with follow up and eye exam and pain management if needed - Can get an MRI but the diagnosis is clear and you have improved would hold off - Gabapentin made him tired. He got through the worst nights with percocet will give one refill  - We reviewed images of the trigeminal nerve online and the facial nerve online, discussed Ramsey hunt, his treatment and timeline. Very nice and funny gentleman, appreciate the referral from Dr. Merlyn Starring and thrilled that patient is improving so quickly  Meds ordered this encounter  Medications   oxyCODONE -acetaminophen  (PERCOCET) 10-325 MG tablet    Sig: Take 1 tablet by mouth every 4 (four) hours as needed for pain.    Dispense:  42 tablet    Refill:  0    Cc: Angelique Barer, MD,  Angelique Barer, MD  Aldona Amel, MD  [facility] [address] [place], [place] [address]  Phone [phone] Fax [phone]  I spent over 45 minutes of face-to-face and non-face-to-face time with patient on the  1. Vision changes   2. Right-sided headache    diagnosis.  This included previsit chart review, lab review, study review, order entry, electronic health record documentation, patient education on the different diagnostic and therapeutic options, counseling and coordination of care, risks and benefits of management, compliance, or risk factor reduction

## 2023-07-24 ENCOUNTER — Telehealth: Payer: Self-pay | Admitting: Neurology

## 2023-07-24 NOTE — Telephone Encounter (Signed)
 Spoke to patient wants DR Tresia Fruit to cancel her rx  oxyCODONE -acetaminophen  (PERCOCET) 10-325 MG tablet  Pt states spoke to PCP who prescribed   oxyCODONE -acetaminophen  5-325 1 tablet 4x daily and pcp has agreed to continue refilling this rx monthly. Pt states he was trying to use dr Tricia Fuelling rx to hold in case the 5-325 doesn't help pain . Pt states PCP informed him that was illegal and not able to do that. Pt states will continue filling PCP prescription. Pt thanked me for calling

## 2023-07-24 NOTE — Telephone Encounter (Signed)
 Pt called stating that he is needing to speak to there RN regarding the oxyCODONE -acetaminophen  (PERCOCET) 10-325 MG tablet that was prescribed to him. Pt states that it was flagged at the pharmacy due to a similar prescription already written for him. Please advise.

## 2024-01-17 ENCOUNTER — Ambulatory Visit: Attending: Cardiology | Admitting: Cardiology

## 2024-01-17 ENCOUNTER — Encounter: Payer: Self-pay | Admitting: Cardiology

## 2024-01-17 VITALS — BP 130/70 | HR 62 | Ht 69.0 in | Wt 214.8 lb

## 2024-01-17 DIAGNOSIS — I10 Essential (primary) hypertension: Secondary | ICD-10-CM | POA: Diagnosis not present

## 2024-01-17 DIAGNOSIS — E119 Type 2 diabetes mellitus without complications: Secondary | ICD-10-CM | POA: Diagnosis not present

## 2024-01-17 DIAGNOSIS — I2510 Atherosclerotic heart disease of native coronary artery without angina pectoris: Secondary | ICD-10-CM

## 2024-01-17 DIAGNOSIS — Z951 Presence of aortocoronary bypass graft: Secondary | ICD-10-CM | POA: Diagnosis not present

## 2024-01-17 NOTE — Patient Instructions (Signed)
 Medication Instructions:  Your physician recommends that you continue on your current medications as directed. Please refer to the Current Medication list given to you today.  *If you need a refill on your cardiac medications before your next appointment, please call your pharmacy*  Lab Work: NONE If you have labs (blood work) drawn today and your tests are completely normal, you will receive your results only by: MyChart Message (if you have MyChart) OR A paper copy in the mail If you have any lab test that is abnormal or we need to change your treatment, we will call you to review the results.  Testing/Procedures: NONE  Follow-Up: At [facility], you and your health needs are our priority.  As part of our continuing mission to provide you with exceptional heart care, our providers are all part of one team.  This team includes your primary Cardiologist (physician) and Advanced Practice Providers or APPs (Physician Assistants and Nurse Practitioners) who all work together to provide you with the care you need, when you need it.  Your next appointment:   9 month(s)  Provider:   Jennifer Crape, MD    We recommend signing up for the patient portal called MyChart.  Sign up information is provided on this After Visit Summary.  MyChart is used to connect with patients for Virtual Visits (Telemedicine).  Patients are able to view lab/test results, encounter notes, upcoming appointments, etc.  Non-urgent messages can be sent to your provider as well.   To learn more about what you can do with MyChart, go to [email or web address].   Other Instructions

## 2024-01-17 NOTE — Progress Notes (Signed)
 Cardiology Office Note:    Date:  01/17/2024   ID:  Eddie Nelson, DOB 1955/09/20, MRN [id number]  PCP:  Jama Chow, MD  Cardiologist:  Jennifer JONELLE Crape, MD   Referring MD: Jama Chow, MD    ASSESSMENT:    1. Coronary artery disease involving native coronary artery of native heart without angina pectoris   2. Essential hypertension   3. Diet-controlled diabetes mellitus (HCC)   4. S/P CABG x 3    PLAN:    In order of problems listed above:  Coronary artery disease: Secondary prevention stressed with the patient.  Importance of compliance with diet medication stressed and patient verbalized standing.  He was advised to walk at least half a day on a daily basis. Essential hypertension: Blood pressure stable and diet was emphasized.  Lifestyle modification urged. Mixed dyslipidemia: On lipid-lowering medications followed by primary care.  He will have blood work next week and send us  a copy. Obesity: Weight reduction stressed diet emphasized and he promises to do better. Patient will be seen in follow-up appointment in 6 months or earlier if the patient has any concerns.    Medication Adjustments/Labs and Tests Ordered: Current medicines are reviewed at length with the patient today.  Concerns regarding medicines are outlined above.  No orders of the defined types were placed in this encounter.  No orders of the defined types were placed in this encounter.    No chief complaint on file.    History of Present Illness:    Eddie Nelson is a 68 y.o. male.  Patient has past medical history of coronary artery disease, essential hypertension, mixed dyslipidemia and obesity.  Overall he is active but does not exercise on a regular basis.  He denies any chest pain orthopnea or PND.  Earlier this year his stress test was fine.  At the time of my evaluation, the patient is alert awake oriented and in no distress.  Past Medical History:  Diagnosis Date   CAD (coronary  artery disease), native coronary artery 04/11/2018   Chronic lumbar pain 04/10/2018   Diet-controlled diabetes mellitus (HCC) 04/10/2018   Essential hypertension 04/10/2018   Hypertension    Mixed dyslipidemia 04/10/2018   Osteoarthritis    S/P CABG x 3 04/15/2018    Past Surgical History:  Procedure Laterality Date   CORONARY ARTERY BYPASS GRAFT N/A 04/15/2018   Procedure: CORONARY ARTERY BYPASS GRAFTING (CABG), ON PUMP, TIMES THREE, USING LEFT INTERNAL MAMMARY ARTERY  AND ENDOSCOPICALLY HARVESTED LEFT GREATER SAPHENOUS VEIN;  Surgeon: Fleeta Hanford Coy, MD;  Location: [facility];  Service: Open Heart Surgery;  Laterality: N/A;   LEFT HEART CATH AND CORONARY ANGIOGRAPHY N/A 04/11/2018   Procedure: LEFT HEART CATH AND CORONARY ANGIOGRAPHY;  Surgeon: Burnard Debby LABOR, MD;  Location: [facility];  Service: Cardiovascular;  Laterality: N/A;   TEE WITHOUT CARDIOVERSION N/A 04/15/2018   Procedure: TRANSESOPHAGEAL ECHOCARDIOGRAM (TEE);  Surgeon: Fleeta Hanford, Coy, MD;  Location: [facility] OR;  Service: Open Heart Surgery;  Laterality: N/A;    Current Medications: Current Meds  Medication Sig   aspirin  81 MG EC tablet Take 81 mg by mouth daily.   atorvastatin  (LIPITOR ) 80 MG tablet Take 1 tablet (80 mg total) by mouth daily at 6 PM.   ezetimibe (ZETIA) 10 MG tablet Take 10 mg by mouth daily.   furosemide  (LASIX ) 20 MG tablet Take 20 mg by mouth daily as needed for edema or fluid.   lisinopril  (PRINIVIL ,ZESTRIL ) 10 MG tablet  Take 1 tablet by mouth 2 (two) times daily.   metoprolol  succinate (TOPROL -XL) 25 MG 24 hr tablet Take 25 mg by mouth daily.   nitroGLYCERIN  (NITROSTAT ) 0.4 MG SL tablet Place 0.4 mg under the tongue every 5 (five) minutes as needed for chest pain.   oxyCODONE -acetaminophen  (PERCOCET/ROXICET) 5-325 MG tablet Take 1 tablet by mouth every 6 (six) hours as needed for moderate pain (pain score 4-6) or severe pain (pain score 7-10).     Allergies:   Patient has no known allergies.   Social  History   Socioeconomic History   Marital status: Single    Spouse name: Not on file   Number of children: 0   Years of education: Not on file   Highest education level: Bachelor's degree (e.g., BA, AB, BS)  Occupational History   Not on file  Tobacco Use   Smoking status: Former    Passive exposure: Never   Smokeless tobacco: Never  Vaping Use   Vaping status: Never Used  Substance and Sexual Activity   Alcohol use: Not Currently    Alcohol/week: 2.0 standard drinks of alcohol    Types: 1 Cans of beer, 1 Shots of liquor per week   Drug use: Not Currently   Sexual activity: Not on file  Other Topics Concern   Not on file  Social History Narrative   Not on file   Social Drivers of Health   Financial Resource Strain: Not on file  Food Insecurity: Not on file  Transportation Needs: Not on file  Physical Activity: Not on file  Stress: Not on file  Social Connections: Not on file     Family History: The patient's family history includes Heart disease in his father; Hodgkin's lymphoma in his sister; Hyperlipidemia in his brother.  ROS:   Please see the history of present illness.    All other systems reviewed and are negative.  EKGs/Labs/Other Studies Reviewed:    The following studies were reviewed today: .SABRA   I discussed my findings with the patient at length   Recent Labs: No results found for requested labs within last 365 days.  Recent Lipid Panel    Component Value Date/Time   CHOL 121 03/30/2022 1111   TRIG 116 03/30/2022 1111   HDL 30 (L) 03/30/2022 1111   CHOLHDL 4.0 03/30/2022 1111   LDLCALC 70 03/30/2022 1111    Physical Exam:    VS:  BP 130/70   Pulse 62   Ht 5' 9 (1.753 m)   Wt 214 lb 12.8 oz (97.4 kg)   SpO2 95%   BMI 31.72 kg/m     Wt Readings from Last 3 Encounters:  01/17/24 214 lb 12.8 oz (97.4 kg)  07/20/23 212 lb (96.2 kg)  03/13/23 210 lb (95.3 kg)     GEN: Patient is in no acute distress HEENT: Normal NECK: No JVD; No  carotid bruits LYMPHATICS: No lymphadenopathy CARDIAC: Hear sounds regular, 2/6 systolic murmur at the apex. RESPIRATORY:  Clear to auscultation without rales, wheezing or rhonchi  ABDOMEN: Soft, non-tender, non-distended MUSCULOSKELETAL:  No edema; No deformity  SKIN: Warm and dry NEUROLOGIC:  Alert and oriented x 3 PSYCHIATRIC:  Normal affect   Signed, Jennifer JONELLE Crape, MD  01/17/2024 4:31 PM    [facility]
